# Patient Record
Sex: Female | Born: 2000 | Race: White | Hispanic: No | Marital: Single | Smoking: Never smoker
Health system: Southern US, Community
[De-identification: ages and names within clinical notes are randomized; demographics above are authoritative.]

## PROBLEM LIST (undated history)

## (undated) DIAGNOSIS — Z5189 Encounter for other specified aftercare: Secondary | ICD-10-CM

## (undated) DIAGNOSIS — F909 Attention-deficit hyperactivity disorder, unspecified type: Secondary | ICD-10-CM

## (undated) DIAGNOSIS — F419 Anxiety disorder, unspecified: Secondary | ICD-10-CM

## (undated) DIAGNOSIS — F32A Depression, unspecified: Secondary | ICD-10-CM

## (undated) HISTORY — DX: Anxiety disorder, unspecified: F41.9

## (undated) HISTORY — DX: Depression, unspecified: F32.A

## (undated) HISTORY — PX: TONSILLECTOMY: SUR1361

## (undated) HISTORY — DX: Encounter for other specified aftercare: Z51.89

## (undated) HISTORY — DX: Attention-deficit hyperactivity disorder, unspecified type: F90.9

---
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
## 2021-01-03 ENCOUNTER — Other Ambulatory Visit: Payer: Self-pay

## 2021-01-03 ENCOUNTER — Encounter: Payer: Self-pay | Admitting: Adult Health

## 2021-01-03 ENCOUNTER — Ambulatory Visit (INDEPENDENT_AMBULATORY_CARE_PROVIDER_SITE_OTHER): Payer: Medicaid Other | Admitting: Adult Health

## 2021-01-03 VITALS — BP 125/75 | HR 87 | Ht 66.0 in | Wt 194.0 lb

## 2021-01-03 DIAGNOSIS — Z30013 Encounter for initial prescription of injectable contraceptive: Secondary | ICD-10-CM | POA: Diagnosis not present

## 2021-01-03 DIAGNOSIS — Z113 Encounter for screening for infections with a predominantly sexual mode of transmission: Secondary | ICD-10-CM | POA: Diagnosis not present

## 2021-01-03 DIAGNOSIS — Z3202 Encounter for pregnancy test, result negative: Secondary | ICD-10-CM | POA: Insufficient documentation

## 2021-01-03 LAB — POCT URINE PREGNANCY: Preg Test, Ur: NEGATIVE

## 2021-01-03 MED ORDER — MEDROXYPROGESTERONE ACETATE 150 MG/ML IM SUSP
150.0000 mg | Freq: Once | INTRAMUSCULAR | Status: AC
  Administered 2021-01-03: 150 mg via INTRAMUSCULAR

## 2021-01-03 MED ORDER — MEDROXYPROGESTERONE ACETATE 150 MG/ML IM SUSP: 150.0000 mg | INTRAMUSCULAR | 4 refills | Status: DC

## 2021-01-03 NOTE — Progress Notes (Signed)
  Subjective:     Patient ID: Kelly Bray, female   DOB: 2000-12-04, 20 y.o.   MRN: [id number]  HPI Kelly Bray is a 20 year old white female,single, G0P0, in requesting to get back on Depo. PCP is [facility]   Review of Systems Periods not regular, last one in October Last sex over 2 months ago Reviewed past medical,surgical, social and family history. Reviewed medications and allergies.     Objective:   Physical Exam BP 125/75 (BP Location: Left Arm, Patient Position: Sitting, Cuff Size: Normal)   Pulse 87   Ht 5\' 6"  (1.676 m)   Wt 194 lb (88 kg)   LMP 07/19/2020 (Approximate)   BMI 31.31 kg/m UPT is negative. Skin warm and dry. Lungs: clear to ausculation bilaterally. Cardiovascular: regular rate and rhythm. Exam by 07/21/2020 NP student.  Upstream - 01/03/21 1216      Pregnancy Intention Screening   Does the patient want to become pregnant in the next year? No    Does the patient's partner want to become pregnant in the next year? No    Would the patient like to discuss contraceptive options today? No      Contraception Wrap Up   Current Method Female Condom    End Method Hormonal Injection    Contraception Counseling Provided No             Assessment:       1. Negative pregnancy test  2. Encounter for initial prescription of injectable contraceptive Will give depo in office today and rx sent Meds ordered this encounter  Medications  . medroxyPROGESTERone (DEPO-PROVERA) 150 MG/ML injection    Sig: Inject 1 mL (150 mg total) into the muscle every 3 (three) months.    Dispense:  1 mL    Refill:  4    Order Specific Question:   Supervising Provider    Answer:   03/05/21, LUTHER H [[id number]]  . medroxyPROGESTERone (DEPO-PROVERA) injection 150 mg  Use condoms   3. Screening examination for STD (sexually transmitted disease) GC/CHL sent on urine    Plan:   Return in about 12 weeks for depo Pap at 22

## 2021-01-03 NOTE — Patient Instructions (Signed)
12  

## 2021-01-18 ENCOUNTER — Encounter: Payer: Self-pay | Admitting: Pediatrics

## 2021-01-18 ENCOUNTER — Ambulatory Visit: Payer: Medicaid Other

## 2021-01-23 ENCOUNTER — Other Ambulatory Visit: Payer: Medicaid Other

## 2021-02-10 DIAGNOSIS — L700 Acne vulgaris: Secondary | ICD-10-CM | POA: Diagnosis not present

## 2021-03-21 ENCOUNTER — Ambulatory Visit: Payer: Medicaid Other

## 2021-05-17 ENCOUNTER — Telehealth: Payer: Self-pay | Admitting: Adult Health

## 2021-05-17 NOTE — Telephone Encounter (Signed)
Patient called stating that she has been bleeding a ot since she has stopped her Depo, patient is going to come in for an IUD insertion but her bleeding is pretty bad. Pt would like a call back if possible tomorrow morning.

## 2021-05-19 NOTE — Telephone Encounter (Signed)
Left message, to call me back

## 2021-05-22 ENCOUNTER — Other Ambulatory Visit: Payer: Self-pay

## 2021-05-22 ENCOUNTER — Ambulatory Visit (INDEPENDENT_AMBULATORY_CARE_PROVIDER_SITE_OTHER): Payer: Medicaid Other | Admitting: Adult Health

## 2021-05-22 ENCOUNTER — Encounter: Payer: Self-pay | Admitting: Adult Health

## 2021-05-22 VITALS — BP 125/70 | HR 85 | Ht 66.0 in | Wt 179.6 lb

## 2021-05-22 DIAGNOSIS — Z113 Encounter for screening for infections with a predominantly sexual mode of transmission: Secondary | ICD-10-CM

## 2021-05-22 DIAGNOSIS — Z3202 Encounter for pregnancy test, result negative: Secondary | ICD-10-CM

## 2021-05-22 DIAGNOSIS — N926 Irregular menstruation, unspecified: Secondary | ICD-10-CM | POA: Diagnosis not present

## 2021-05-22 DIAGNOSIS — Z3009 Encounter for other general counseling and advice on contraception: Secondary | ICD-10-CM | POA: Diagnosis not present

## 2021-05-22 LAB — POCT URINE PREGNANCY: Preg Test, Ur: NEGATIVE

## 2021-05-22 MED ORDER — MISOPROSTOL 200 MCG PO TABS: ORAL_TABLET | ORAL | 0 refills | Status: DC

## 2021-05-22 NOTE — Progress Notes (Signed)
  Subjective:     Patient ID: Kelly Bray, female   DOB: [birth year], 20 y.o.   MRN: [id number]  HPI Kelly Bray is a 20 year old white female,single, G0P0, in to discuss birth control, she is thinking mirena. Periods are irregular, and does not have unless on birth control. She stopped depo, and says OCs give her acne.  Review of Systems +irregular periods +acne No complaints with sex Reviewed past medical,surgical, social and family history. Reviewed medications and allergies.     Objective:   Physical Exam BP 125/70 (BP Location: Left Arm, Patient Position: Sitting, Cuff Size: Normal)   Pulse 85   Ht 5\' 6"  (1.676 m)   Wt 179 lb 9.6 oz (81.5 kg)   LMP 03/31/2021 (Approximate) Comment: has been bleeding since she stopped the depo  BMI 28.99 kg/m  UPT is negative.Skin warm and dry. Neck: mid line trachea, normal thyroid, good ROM, no lymphadenopathy noted. Lungs: clear to ausculation bilaterally. Cardiovascular: regular rate and rhythm.   Upstream - 05/22/21 0920       Pregnancy Intention Screening   Does the patient want to become pregnant in the next year? No    Does the patient's partner want to become pregnant in the next year? No    Would the patient like to discuss contraceptive options today? No      Contraception Wrap Up   Current Method Female Condom    End Method IUD or IUS;Female Condom    Contraception Counseling Provided Yes                Assessment:      1. Pregnancy test negative  2. Screen for STD (sexually transmitted disease Urine sent for GC/CHL  3. General counseling and advice on contraceptive management Discussed options and she wants mirena IUD, handout given  4. Irregular periods     Plan:     NO sex till after IUD placed Will rx cytotec before IUD placement Meds ordered this encounter  Medications   misoprostol (CYTOTEC) 200 MCG tablet    Sig: Take 2 tablets in am, 4 hours prior to IUD in afternoon    Dispense:  2 tablet    Refill:   0    Order Specific Question:   Supervising Provider    Answer:   05/24/21 [[id number]]   Return 9/1 for IUD placement with Dr 11/1 in pm

## 2021-05-25 LAB — GC/CHLAMYDIA PROBE AMP
Chlamydia trachomatis, NAA: NEGATIVE
Neisseria Gonorrhoeae by PCR: NEGATIVE

## 2021-05-29 ENCOUNTER — Telehealth: Payer: Self-pay

## 2021-05-29 NOTE — Telephone Encounter (Signed)
-----   Message from Adline Potter, NP sent at 05/26/2021  2:08 PM EDT ----- Let pt know GC.CHL negative

## 2021-05-29 NOTE — Telephone Encounter (Signed)
Called pt to relay test results. Pt confirmed understanding, no questions.

## 2021-05-30 DIAGNOSIS — S66911A Strain of unspecified muscle, fascia and tendon at wrist and hand level, right hand, initial encounter: Secondary | ICD-10-CM | POA: Diagnosis not present

## 2021-05-31 ENCOUNTER — Ambulatory Visit: Payer: Medicaid Other | Admitting: Adult Health

## 2021-05-31 ENCOUNTER — Encounter: Payer: Self-pay | Admitting: Women's Health

## 2021-05-31 ENCOUNTER — Ambulatory Visit (INDEPENDENT_AMBULATORY_CARE_PROVIDER_SITE_OTHER): Payer: Medicaid Other | Admitting: Women's Health

## 2021-05-31 ENCOUNTER — Other Ambulatory Visit: Payer: Self-pay

## 2021-05-31 VITALS — BP 116/67 | HR 82 | Ht 66.0 in | Wt 179.0 lb

## 2021-05-31 DIAGNOSIS — Z3202 Encounter for pregnancy test, result negative: Secondary | ICD-10-CM | POA: Diagnosis not present

## 2021-05-31 DIAGNOSIS — Z30430 Encounter for insertion of intrauterine contraceptive device: Secondary | ICD-10-CM | POA: Diagnosis not present

## 2021-05-31 LAB — POCT URINE PREGNANCY: Preg Test, Ur: NEGATIVE

## 2021-05-31 MED ORDER — LEVONORGESTREL 20 MCG/DAY IU IUD
1.0000 | INTRAUTERINE_SYSTEM | Freq: Once | INTRAUTERINE | Status: AC
  Administered 2021-05-31: 1 via INTRAUTERINE

## 2021-05-31 NOTE — Progress Notes (Signed)
   IUD INSERTION Patient name: Kelly Bray MRN [id number]  Date of birth: 05/06/2001 Subjective Findings:   Kelly Bray is a 20 y.o. G0P0000 Caucasian female being seen today for insertion of a Mirena IUD. Took cytotec prior as directed.  No LMP recorded. Last sexual intercourse was >2wks ago Last pap<22yo. Results were: N/A  The risks and benefits of the method and placement have been thouroughly reviewed with the patient and all questions were answered.  Specifically the patient is aware of failure rate of 10/998, expulsion of the IUD and of possible perforation.  The patient is aware of irregular bleeding due to the method and understands the incidence of irregular bleeding diminishes with time.  Signed copy of informed consent in chart.   Depression screen [facility] 2/9 10/09/2019 12/22/2014  Decreased Interest 0 0  Down, Depressed, Hopeless 0 0  PHQ - 2 Score 0 0    No flowsheet data found.   Pertinent History Reviewed:   Reviewed past medical,surgical, social, obstetrical and family history.  Reviewed problem list, medications and allergies. Objective Findings & Procedure:   Vitals:   05/31/21 1453  BP: 116/67  Pulse: 82  Weight: 179 lb (81.2 kg)  Height: 5\' 6"  (1.676 m)  Body mass index is 28.89 kg/m.  Results for orders placed or performed in visit on 05/31/21 (from the past 24 hour(s))  POCT urine pregnancy   Collection Time: 05/31/21  2:57 PM  Result Value Ref Range   Preg Test, Ur Negative Negative     Time out was performed.  A graves speculum was placed in the vagina.  The cervix was visualized, prepped using Betadine, and grasped with a single tooth tenaculum. The uterus was found to be anteroflexed and it sounded to 7 cm.  Mirena  IUD placed per manufacturer's recommendations. The strings were trimmed to approximately 3 cm. The patient became nauseated and vomited.  Ibuprofen 800mg  po given for cramping per request  Informal transvaginal sonogram was  performed and the proper placement of the IUD was verified.  Chaperone: 06/02/21   Assessment & Plan:   1) Mirena IUD insertion The patient was given post procedure instructions, including signs and symptoms of infection and to check for the strings after each menses or each month, and refraining from intercourse or anything in the vagina for 3 days. She was given a care card with date IUD placed, and date IUD to be removed. She is scheduled for a f/u appointment in 4 weeks.  Orders Placed This Encounter  Procedures   POCT urine pregnancy    Return in about 4 weeks (around 06/28/2021) for IUD f/u, CNM, in person.  Faith Rogue CNM, [facility] 05/31/2021 3:52 PM

## 2021-05-31 NOTE — Patient Instructions (Signed)
Nothing in vagina for 3 days (no sex, douching, tampons, etc...) Check your strings once a month to make sure you can feel them, if you are not able to please let us know If you develop a fever of 100.4 or more in the next few weeks, or if you develop severe abdominal pain, please let us know Use a backup method of birth control, such as condoms, for 2 weeks  Intrauterine Device Insertion, Care After This sheet gives you information about how to care for yourself after your procedure. Your health care provider may also give you more specific instructions. If you have problems or questions, contact your health care provider. What can I expect after the procedure? After the procedure, it is common to have: Cramps and pain in the abdomen. Bleeding. It may be light or heavy. This may last for a few days. Lower back pain. Dizziness. Headaches. Nausea. Follow these instructions at home:  Before resuming sexual activity, check to make sure that you can feel the IUD string or strings. You should be able to feel the end of the string below the opening of your cervix. If your IUD string is in place, you may resume sexual activity. If you had a hormonal IUD inserted more than 7 days after your most recent period started, you will need to use a backup method of birth control for 7 days after IUD insertion. Ask your health care provider whether this applies to you. Continue to check that the IUD is still in place by feeling for the strings after every menstrual period, or once a month. An IUD will not protect you from sexually transmitted infections (STIs). Use methods to prevent the exchange of body fluids between partners (barrier protection) every time you have sex. Barrier protection can be used during oral, vaginal, or anal sex. Commonly used barrier methods include: Female condom. Female condom. Dental dam. Take over-the-counter and prescription medicines only as told by your health care  provider. Keep all follow-up visits as told by your health care provider. This is important. Contact a health care provider if: You feel light-headed or weak. You have any of the following problems with your IUD string or strings: The string bothers or hurts you or your sexual partner. You cannot feel the string. The string has gotten longer. You can feel the IUD in your vagina. You think you may be pregnant, or you miss your menstrual period. You think you may have a sexually transmitted infection (STI). Get help right away if: You have flu-like symptoms, such as tiredness (fatigue) and muscle aches. You have a fever and chills. You have bleeding that is heavier or lasts longer than a normal menstrual cycle. You have abnormal or bad-smelling discharge from your vagina. You develop abdominal pain that is new, is getting worse, or is not in the same area of earlier cramping and pain. You have pain during sexual activity. Summary After the procedure, it is common to have cramps and pain in the abdomen. It is also common to have light bleeding or heavier bleeding that is like your menstrual period. Continue to check that the IUD is still in place by feeling for the strings after every menstrual period, or once a month. Keep all follow-up visits as told by your health care provider. This is important. Contact your health care provider if you have problems with your IUD strings, such as the string getting longer or bothering you or your sexual partner. This information is not intended   to replace advice given to you by your health care provider. Make sure you discuss any questions you have with your health care provider. Document Revised: 09/08/2019 Document Reviewed: 09/08/2019 Elsevier Patient Education  2022 Elsevier Inc.  

## 2021-06-01 ENCOUNTER — Ambulatory Visit: Payer: Medicaid Other | Admitting: Obstetrics & Gynecology

## 2021-06-06 DIAGNOSIS — H00021 Hordeolum internum right upper eyelid: Secondary | ICD-10-CM | POA: Diagnosis not present

## 2021-06-28 ENCOUNTER — Ambulatory Visit: Payer: Medicaid Other | Admitting: Women's Health

## 2021-07-27 DIAGNOSIS — H00015 Hordeolum externum left lower eyelid: Secondary | ICD-10-CM | POA: Diagnosis not present

## 2021-11-30 ENCOUNTER — Ambulatory Visit: Payer: Medicaid Other | Admitting: Women's Health

## 2021-12-07 DIAGNOSIS — F411 Generalized anxiety disorder: Secondary | ICD-10-CM | POA: Diagnosis not present

## 2021-12-07 DIAGNOSIS — F321 Major depressive disorder, single episode, moderate: Secondary | ICD-10-CM | POA: Diagnosis not present

## 2021-12-12 ENCOUNTER — Encounter: Payer: Self-pay | Admitting: Adult Health

## 2021-12-12 ENCOUNTER — Ambulatory Visit (INDEPENDENT_AMBULATORY_CARE_PROVIDER_SITE_OTHER): Payer: Medicaid Other | Admitting: Adult Health

## 2021-12-12 ENCOUNTER — Other Ambulatory Visit: Payer: Self-pay

## 2021-12-12 VITALS — BP 100/64 | HR 71 | Ht 67.0 in | Wt 201.0 lb

## 2021-12-12 DIAGNOSIS — N926 Irregular menstruation, unspecified: Secondary | ICD-10-CM | POA: Diagnosis not present

## 2021-12-12 DIAGNOSIS — N911 Secondary amenorrhea: Secondary | ICD-10-CM | POA: Insufficient documentation

## 2021-12-12 DIAGNOSIS — Z975 Presence of (intrauterine) contraceptive device: Secondary | ICD-10-CM

## 2021-12-12 NOTE — Progress Notes (Signed)
?  Subjective:  ?  ? Patient ID: Kelly Bray, female   DOB: [birth year], 21 y.o.   MRN: [id number] ? ?HPI ?Kelly Bray is a 21 year old white female, single, G0P0 in wondering if hormones off.Has no period, is having anxiety, and depression, moody and acne. ?Had IUD placed 05/31/21. She did not start period til age 21, when started on OCs. ? ? ?Review of Systems ?History of irregualr periods, or no periods unless on birth control, did not start til age 21, when place on OCs ?Has anxiety and depression, recently started on Prozac ?Moody swings ?Acne ?Reviewed past medical,surgical, social and family history. Reviewed medications and allergies.  ?   ?Objective:  ? Physical Exam ?BP 100/64 (BP Location: Left Arm, Patient Position: Sitting, Cuff Size: Large)   Pulse 71   Ht 5\' 7"  (1.702 m)   Wt 201 lb (91.2 kg)   BMI 31.48 kg/m?   ?  Skin warm and dry.Pelvic: external genitalia is normal in appearance no lesions, vagina: scant brown discharge without odor,urethra has no lesions or masses noted, cervix:smooth, no IUD strings seen at os, POC US showed [place] in place, uterus: normal size, shape and contour, non tender, no masses felt, adnexa: no masses or tenderness noted. Bladder is non tender and no masses felt.  ?Fall risk is low ? Upstream - 12/12/21 1615   ? ?  ? Pregnancy Intention Screening  ? Does the patient want to become pregnant in the next year? No   ? Does the patient's partner want to become pregnant in the next year? No   ? Would the patient like to discuss contraceptive options today? No   ?  ? Contraception Wrap Up  ? Current Method IUD or IUS   ? End Method IUD or IUS   ? ?  ?  ? ?  ? Examination chaperoned by Levy Pupa LPN ? ?Assessment:  ?   ?1. Irregular periods ? ?2. IUD (intrauterine device) in place ?Mirena Placed 05/31/21 ? ?3. Secondary amenorrhea ?Will check [place] and testosterone, frre and total ?   ?Plan:  ?   ?Follow up in 4 weeks  ?   ?

## 2021-12-21 DIAGNOSIS — F411 Generalized anxiety disorder: Secondary | ICD-10-CM | POA: Diagnosis not present

## 2021-12-21 DIAGNOSIS — F321 Major depressive disorder, single episode, moderate: Secondary | ICD-10-CM | POA: Diagnosis not present

## 2022-01-09 ENCOUNTER — Ambulatory Visit: Payer: Medicaid Other | Admitting: Adult Health

## 2022-01-16 DIAGNOSIS — F411 Generalized anxiety disorder: Secondary | ICD-10-CM | POA: Diagnosis not present

## 2022-01-16 DIAGNOSIS — F321 Major depressive disorder, single episode, moderate: Secondary | ICD-10-CM | POA: Diagnosis not present

## 2022-01-23 ENCOUNTER — Other Ambulatory Visit (HOSPITAL_COMMUNITY)
Admission: RE | Admit: 2022-01-23 | Discharge: 2022-01-23 | Disposition: A | Discharge status: Home / Self Care | Payer: Medicaid Other | Source: Ambulatory Visit | Attending: Obstetrics and Gynecology | Admitting: Obstetrics and Gynecology

## 2022-01-23 ENCOUNTER — Encounter: Payer: Self-pay | Admitting: Obstetrics and Gynecology

## 2022-01-23 ENCOUNTER — Ambulatory Visit (INDEPENDENT_AMBULATORY_CARE_PROVIDER_SITE_OTHER): Payer: 59 | Admitting: Obstetrics and Gynecology

## 2022-01-23 VITALS — BP 110/68 | HR 78 | Ht 67.0 in | Wt 201.0 lb

## 2022-01-23 DIAGNOSIS — N898 Other specified noninflammatory disorders of vagina: Secondary | ICD-10-CM | POA: Insufficient documentation

## 2022-01-23 DIAGNOSIS — Z01419 Encounter for gynecological examination (general) (routine) without abnormal findings: Secondary | ICD-10-CM | POA: Diagnosis not present

## 2022-01-23 DIAGNOSIS — Z113 Encounter for screening for infections with a predominantly sexual mode of transmission: Secondary | ICD-10-CM | POA: Diagnosis not present

## 2022-01-23 NOTE — Patient Instructions (Signed)

## 2022-01-23 NOTE — Progress Notes (Signed)
21 y.o. G55P0000 Single Caucasian female here for annual exam.   ? ?Has Mirena IUD.  ?Had one menstrual cycle.  ? ?Was on birth control, Depo Provera in past.  ?Generally does not like hormones. ? ?Her periods started at age 25 yo.  ?Tested for PCOS which was negative.  ?She has had normal FSH, prolactin, and TSH in 202.  ? ?She [facility].  ?POC US showed the IUD in the uterus.  Normal uterus.  ? ?Experiencing some anxiety.  ?Taking Zoloft and doing counseling.  ? ?Having vaginal discharge are odor all the time.  ?She douches. ? ?New partner.  ?Accepts vaginal/cervical SD testing but not blood work today. ? ?Going to cosmatology school.  ?Working at [employer].  ?Living with her boyfriend.  ? ? ?PCP:   None ? ?No LMP recorded. (Menstrual status: IUD).     ?Period Cycle (Days):  (no having cycles with Mirena IUD) ?    ?Sexually active: Yes.    ?The current method of family planning is IUD--Mirena 07/2021.    ?Exercising: No.  The patient does not participate in regular exercise at present. ?Smoker:  no ? ?Health Maintenance: ?Pap:  n/a ?History of abnormal Pap:  n/a ?MMG:  n/a ?Colonoscopy:  n/a ?BMD:   n/a  Result  n/a ?TDaP:  02-03-13 ?Gardasil:   thinks she completed ?HIV: never ?Hep C: never ?Screening Labs:   ? ? reports that she has never smoked. She has been exposed to tobacco smoke. She has never used smokeless tobacco. She reports that she does not drink alcohol and does not use drugs. ? ?Past Medical History:  ?Diagnosis Date  ? ADHD (attention deficit hyperactivity disorder)   ? ADHD (attention deficit hyperactivity disorder)   ? Anxiety   ? Blood transfusion without reported diagnosis   ? after tonsillectomy  ? Depression   ? ? ?Past Surgical History:  ?Procedure Laterality Date  ? TONSILLECTOMY    ? TONSILLECTOMY AND ADENOIDECTOMY N/A 12/19/2019  ? Procedure: CONTROL OF TONSIL BLEED;  Surgeon: Helayne Seminole, MD;  Location: [facility];  Service: ENT;  Laterality: N/A;   ? ? ?Current Outpatient Medications  ?Medication Sig Dispense Refill  ? cetirizine (ZYRTEC) 10 MG tablet Take 10 mg by mouth daily.    ? levonorgestrel (MIRENA) 20 MCG/DAY IUD 1 each by Intrauterine route once.    ? sertraline (ZOLOFT) 25 MG tablet Take 25 mg by mouth daily.    ? ?No current facility-administered medications for this visit.  ? ? ?History reviewed. No pertinent family history. ? ?Review of Systems  ?Genitourinary:  Positive for vaginal discharge (with odor).  ?All other systems reviewed and are negative. ? ?Exam:   ?BP 110/68   Pulse 78   Ht 5\' 7"  (1.702 m)   Wt 201 lb (91.2 kg)   SpO2 98%   BMI 31.48 kg/m?     ?General appearance: alert, cooperative and appears stated age ?Head: normocephalic, without obvious abnormality, atraumatic ?Neck: no adenopathy, supple, symmetrical, trachea midline and thyroid normal to inspection and palpation ?Lungs: clear to auscultation bilaterally ?Breasts: normal appearance, no masses or tenderness, No nipple retraction or dimpling, No nipple discharge or bleeding, No axillary adenopathy ?Heart: regular rate and rhythm ?Abdomen: soft, non-tender; no masses, no organomegaly ?Extremities: extremities normal, atraumatic, no cyanosis or edema ?Skin: skin color, texture, turgor normal. No rashes or lesions ?Lymph nodes: cervical, supraclavicular, and axillary nodes normal. ?Neurologic: grossly normal ? ?Pelvic: External genitalia:  no lesions ?             No abnormal inguinal nodes palpated. ?             Urethra:  normal appearing urethra with no masses, tenderness or lesions ?             Bartholins and Skenes: normal    ?             Vagina: normal appearing vagina with normal color and discharge, no lesions ?             Cervix: no lesions.  IUD strings not seen.  ?             Pap taken: no ?Bimanual Exam:  Uterus:  normal size, contour, position, consistency, mobility, non-tender ?             Adnexa: no mass, fullness, tenderness ?         ? ?Chaperone was  present for exam:  Estill Bamberg, CMA ? ?Assessment:   ?Well woman visit with gynecologic exam. ?Mirena IUD.  Recent US confirming location at [facility].  ?Vaginal odor.  ?STD screening.  ? ?Plan: ?Mammogram screening discussed. ?Self breast awareness reviewed. ?Pap next year. ?Guidelines for Calcium, Vitamin D, regular exercise program including cardiovascular and weight bearing exercise. ?We discussed PCOS and effect on menstruation, cardiovascular disease and fertility.  ?She declines blood work today for hormonal testing and STD screening.  ?Will check [place] for BV, yeast, trichomonas, GC/CT. ?Follow up annually and prn.  ? ?After visit summary provided.  ? ? ? ?

## 2022-01-24 LAB — CERVICOVAGINAL ANCILLARY ONLY
Bacterial Vaginitis (gardnerella): POSITIVE — AB
Candida Glabrata: NEGATIVE
Candida Vaginitis: NEGATIVE
Chlamydia: NEGATIVE
Comment: NEGATIVE
Comment: NEGATIVE
Comment: NEGATIVE
Comment: NEGATIVE
Comment: NEGATIVE
Comment: NORMAL
Neisseria Gonorrhea: NEGATIVE
Trichomonas: NEGATIVE

## 2022-01-26 ENCOUNTER — Other Ambulatory Visit: Payer: Self-pay | Admitting: *Deleted

## 2022-01-26 MED ORDER — METRONIDAZOLE 0.75 % VA GEL: 1.0000 | Freq: Every day | VAGINAL | 0 refills | Status: DC

## 2022-02-23 ENCOUNTER — Other Ambulatory Visit: Payer: Self-pay

## 2022-02-23 DIAGNOSIS — Z30432 Encounter for removal of intrauterine contraceptive device: Secondary | ICD-10-CM

## 2022-02-28 ENCOUNTER — Ambulatory Visit: Payer: 59 | Admitting: Obstetrics and Gynecology

## 2022-03-01 NOTE — Progress Notes (Deleted)
GYNECOLOGY  VISIT   HPI: 21 y.o.   Single  Caucasian  female   G0P0000 with No LMP recorded. (Menstrual status: IUD).   here for Mirena IUD removal.  GYNECOLOGIC HISTORY: No LMP recorded. (Menstrual status: IUD). 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   IUD (intrauterine device) in place 12/12/2021   Encounter for IUD insertion 05/31/2021   Acute blood loss anemia 12/20/2019   Tonsillar bleed 12/19/2019   Irregular periods 10/09/2019   Acne 12/22/2014   Influenza A (H1N1) 10/14/2013   Cutaneous wart 10/14/2013   ADHD (attention deficit hyperactivity disorder) 02/11/2013   Behavioral problems 02/11/2013    Past Medical History:  Diagnosis Date   ADHD (attention deficit hyperactivity disorder)    ADHD (attention deficit hyperactivity disorder)    Anxiety    Blood transfusion without reported diagnosis    after tonsillectomy   Depression     Past Surgical History:  Procedure Laterality Date   TONSILLECTOMY     TONSILLECTOMY AND ADENOIDECTOMY N/A 12/19/2019   Procedure: CONTROL OF TONSIL BLEED;  Surgeon: Graylin Shiver, MD;  Location: [facility] OR;  Service: ENT;  Laterality: N/A;    Current Outpatient Medications  Medication Sig Dispense Refill   cetirizine (ZYRTEC) 10 MG tablet Take 10 mg by mouth daily.     levonorgestrel (MIRENA) 20 MCG/DAY IUD 1 each by Intrauterine route once.     metroNIDAZOLE (METROGEL) 0.75 % vaginal gel Place 1 Applicatorful vaginally at bedtime. For 5 nights. 70 g 0   sertraline (ZOLOFT) 25 MG tablet Take 25 mg by mouth daily.     No current facility-administered medications for this visit.      ALLERGIES: Patient has no known allergies.  No family history on file.  Social History   Socioeconomic History   Marital status: Single    Spouse name: Not on file   Number of children: Not on file   Years of education: Not on file   Highest education level: Not on file  Occupational History   Not on file  Tobacco Use   Smoking status: Never    Passive exposure: Yes   Smokeless tobacco: Never  Vaping Use   Vaping Use: Never used  Substance and Sexual Activity   Alcohol use: No   Drug use: No   Sexual activity: Yes    Birth control/protection: I.U.D.    Comment: Mirena IUD 07/2021  Other Topics Concern   Not on file  Social History Narrative   Lives with mother and brother.   Father and mother seperated.   Do spend some time with the father.   Social Determinants of Health   Financial Resource Strain: Not on file  Food Insecurity: Not on file  Transportation Needs: Not on file  Physical Activity: Not on file  Stress: Not on file  Social Connections: Not on file  Intimate Partner Violence: Not on file    Review of Systems  PHYSICAL EXAMINATION:    There were no vitals taken for this visit.    General appearance: alert, cooperative and appears stated age Head: Normocephalic, without obvious abnormality, atraumatic Neck: no adenopathy, supple, symmetrical, trachea midline and thyroid normal to inspection and palpation Lungs: clear to auscultation bilaterally Breasts: normal appearance, no masses or tenderness, No nipple retraction or dimpling, No nipple discharge or bleeding, No axillary or supraclavicular adenopathy Heart: regular rate and rhythm Abdomen: soft, non-tender, no masses,  no organomegaly Extremities: extremities normal, atraumatic, no cyanosis or edema Skin: Skin color, texture, turgor normal. No rashes or lesions Lymph nodes: Cervical, supraclavicular, and axillary nodes normal. No abnormal inguinal nodes palpated Neurologic: Grossly  normal  Pelvic: External genitalia:  no lesions              Urethra:  normal appearing urethra with no masses, tenderness or lesions              Bartholins and Skenes: normal                 Vagina: normal appearing vagina with normal color and discharge, no lesions              Cervix: no lesions                Bimanual Exam:  Uterus:  normal size, contour, position, consistency, mobility, non-tender              Adnexa: no mass, fullness, tenderness              Rectal exam: {yes no:314532}.  Confirms.              Anus:  normal sphincter tone, no lesions  Chaperone was present for exam:  ***  ASSESSMENT     PLAN     An After Visit Summary was printed and given to the patient.  ______ minutes face to face time of which over 50% was spent in counseling.

## 2022-03-06 ENCOUNTER — Ambulatory Visit: Payer: 59 | Admitting: Obstetrics and Gynecology

## 2022-03-26 NOTE — Progress Notes (Signed)
GYNECOLOGY  VISIT   HPI: 21 y.o.   Single  Caucasian  female   G0P0000 with No LMP recorded. (Menstrual status: IUD).   here for  IUD removal.   Patient interested in a non-hormonal birth control.     Interested in [place] IUD  She feels like her appetite is increased and she has increased anxiety and depression.   She has been on medication in the past for depression and anxiety through [employer].   Tested negative for PCOS in 2018.   IUD strings not seen with prior examination.  She had a point of care pelvic US with the [facility], and the IUD was noted in the endometrial canal.          With her IUD insertion the past, she stated she had a weak stomach, and she threw up.           She states she has vaginal odor today.  Asking about treatment for BV.  No new partner.                                                                                                                                                                                                             GYNECOLOGIC HISTORY: No LMP recorded. (Menstrual status: IUD). 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/12/2021   IUD (intrauterine device) in place 12/12/2021   Encounter for IUD insertion 05/31/2021   Acute blood loss anemia 12/20/2019   Tonsillar bleed 12/19/2019   Irregular periods 10/09/2019   Acne 12/22/2014   Influenza A (H1N1) 10/14/2013   Cutaneous wart 10/14/2013   ADHD (attention deficit hyperactivity disorder) 02/11/2013   Behavioral problems 02/11/2013    Past Medical History:  Diagnosis Date   ADHD (attention deficit hyperactivity  disorder)    ADHD (attention deficit hyperactivity disorder)    Anxiety    Blood transfusion without reported  diagnosis    after tonsillectomy   Depression     Past Surgical History:  Procedure Laterality Date   TONSILLECTOMY     TONSILLECTOMY AND ADENOIDECTOMY N/A 12/19/2019   Procedure: CONTROL OF TONSIL BLEED;  Surgeon: Graylin Shiver, MD;  Location: [facility] OR;  Service: ENT;  Laterality: N/A;    Current Outpatient Medications  Medication Sig Dispense Refill   levonorgestrel (MIRENA) 20 MCG/DAY IUD 1 each by Intrauterine route once.     No current facility-administered medications for this visit.     ALLERGIES: Patient has no known allergies.  History reviewed. No pertinent family history.  Social History   Socioeconomic History   Marital status: Single    Spouse name: Not on file   Number of children: Not on file   Years of education: Not on file   Highest education level: Not on file  Occupational History   Not on file  Tobacco Use   Smoking status: Never    Passive exposure: Yes   Smokeless tobacco: Never  Vaping Use   Vaping Use: Never used  Substance and Sexual Activity   Alcohol use: No   Drug use: No   Sexual activity: Yes    Birth control/protection: I.U.D.    Comment: Mirena IUD 07/2021  Other Topics Concern   Not on file  Social History Narrative   Lives with mother and brother.   Father and mother seperated.   Do spend some time with the father.   Social Determinants of Health   Financial Resource Strain: Not on file  Food Insecurity: Not on file  Transportation Needs: Not on file  Physical Activity: Not on file  Stress: Not on file  Social Connections: Not on file  Intimate Partner Violence: Not on file    Review of Systems  All other systems reviewed and are negative.   PHYSICAL EXAMINATION:    BP 110/70   Ht 5\' 7"  (1.702 m)   Wt 201 lb (91.2 kg)   BMI 31.48 kg/m     General appearance: alert, cooperative and appears stated  age  Pelvic: External genitalia:  no lesions              Urethra:  normal appearing urethra with no masses, tenderness or lesions              Bartholins and Skenes: normal                 Vagina: normal appearing vagina with normal color and discharge, no lesions              Cervix: no lesions.  Yellow discharge.  IUD strings not seen.  Cervix looks fairly closed.                 Bimanual Exam:  Uterus:  normal size, contour, position, consistency, mobility, non-tender              Adnexa: no mass, fullness, tenderness          Chaperone was present for exam:  , CMA  ASSESSMENT  Mirena IUD.  Contraceptive counseling.  Vaginitis.  Hx BV. Depression and anxiety.  PLAN  We discussed Paragard IUD risks and benefits.  She will return for IUD exchange.  Removal of Mirena and placement of Paragard.  Paragard brochure to patient.  Cytotec 200 mcg pv at hs the night before the procedure and the am of the procedure.  Motrin 800 mg po prior to procedure.  Plan for support person available the day of the IUD exchange.  Vaginitis testing through [facility].  Will need to complete any treatment for BV prior to IUD exchange.  I gave The QR reader for [facility] providers and for [place] counseling group.    An After Visit Summary was printed and given to the patient.  34 min  total time was spent for this patient encounter, including preparation, face-to-face counseling with the patient, coordination of care, and documentation of the encounter.

## 2022-03-28 ENCOUNTER — Other Ambulatory Visit (HOSPITAL_COMMUNITY)
Admission: RE | Admit: 2022-03-28 | Discharge: 2022-03-28 | Disposition: A | Discharge status: Home / Self Care | Payer: Medicaid Other | Source: Ambulatory Visit | Attending: Obstetrics and Gynecology | Admitting: Obstetrics and Gynecology

## 2022-03-28 ENCOUNTER — Encounter: Payer: Self-pay | Admitting: Obstetrics and Gynecology

## 2022-03-28 ENCOUNTER — Ambulatory Visit (INDEPENDENT_AMBULATORY_CARE_PROVIDER_SITE_OTHER): Payer: 59 | Admitting: Obstetrics and Gynecology

## 2022-03-28 VITALS — BP 110/70 | Ht 67.0 in | Wt 201.0 lb

## 2022-03-28 DIAGNOSIS — Z3009 Encounter for other general counseling and advice on contraception: Secondary | ICD-10-CM

## 2022-03-28 DIAGNOSIS — N760 Acute vaginitis: Secondary | ICD-10-CM

## 2022-03-28 MED ORDER — MISOPROSTOL 200 MCG PO TABS: ORAL_TABLET | ORAL | 0 refills | Status: DC

## 2022-03-29 LAB — CERVICOVAGINAL ANCILLARY ONLY
Bacterial Vaginitis (gardnerella): NEGATIVE
Candida Glabrata: NEGATIVE
Candida Vaginitis: NEGATIVE
Comment: NEGATIVE
Comment: NEGATIVE
Comment: NEGATIVE
Comment: NEGATIVE
Trichomonas: NEGATIVE

## 2022-04-10 NOTE — Progress Notes (Unsigned)
GYNECOLOGY  VISIT   HPI: 21 y.o.   Single  Caucasian  female   G0P0000 with No LMP recorded. (Menstrual status: IUD).   here for IUD exchange.   Took Motrin prior to the visit.  She did not use Cytotec.   Patient has a Mirena IUD and would like a ParaGard IUD.  Wants to be hormone free for her contraception.   Her strings are not visible and she has had a pelvic US at the [facility] showing her IUD in the endometrial canal.  Negative UPT today.   GYNECOLOGIC HISTORY: No LMP recorded. (Menstrual status: IUD). 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   IUD (intrauterine device) in place 12/12/2021   Encounter for IUD insertion 05/31/2021   Acute blood loss anemia 12/20/2019   Tonsillar bleed 12/19/2019   Irregular periods 10/09/2019   Acne 12/22/2014   Influenza A (H1N1) 10/14/2013   Cutaneous wart 10/14/2013   ADHD (attention deficit hyperactivity disorder) 02/11/2013   Behavioral problems 02/11/2013    Past Medical History:  Diagnosis Date   ADHD (attention deficit hyperactivity disorder)    ADHD (attention deficit hyperactivity disorder)    Anxiety    Blood transfusion without reported diagnosis    after tonsillectomy   Depression     Past Surgical History:  Procedure Laterality Date   TONSILLECTOMY     TONSILLECTOMY AND ADENOIDECTOMY N/A 12/19/2019   Procedure: CONTROL OF TONSIL BLEED;  Surgeon: Graylin Shiver, MD;  Location: [facility] OR;  Service: ENT;  Laterality: N/A;    Current Outpatient Medications  Medication Sig Dispense Refill   levonorgestrel (MIRENA) 20 MCG/DAY IUD 1 each by Intrauterine route  once.     No current facility-administered medications for this visit.     ALLERGIES: Patient has no known allergies.  History reviewed. No pertinent family history.  Social History   Socioeconomic History   Marital status: Single    Spouse name: Not on file   Number of children: Not on file   Years of education: Not on file   Highest education level: Not on file  Occupational History   Not on file  Tobacco Use   Smoking status: Never    Passive exposure: Yes   Smokeless tobacco: Never  Vaping Use   Vaping Use: Never used  Substance and Sexual Activity   Alcohol use: No   Drug use: No   Sexual activity: Yes    Birth control/protection: I.U.D.    Comment: Mirena IUD 07/2021  Other Topics Concern   Not on file  Social History Narrative   Lives with mother and brother.   Father and mother seperated.   Do spend some time with the father.   Social Determinants of Health   Financial Resource Strain: Not on file  Food Insecurity: Not on file  Transportation Needs: Not on  file  Physical Activity: Not on file  Stress: Not on file  Social Connections: Not on file  Intimate Partner Violence: Not on file    Review of Systems  All other systems reviewed and are negative.   PHYSICAL EXAMINATION:    BP 128/70   Pulse 90   Ht 5\' 7"  (1.702 m)   Wt 201 lb (91.2 kg)   SpO2 97%   BMI 31.48 kg/m     General appearance: alert, cooperative and appears stated age    Pelvic: External genitalia:  no lesions              Urethra:  normal appearing urethra with no masses, tenderness or lesions              Bartholins and Skenes: normal                 Vagina: normal appearing vagina with normal color and discharge, no lesions              Cervix: no lesions                Bimanual Exam:  Uterus:  normal size, contour, position, consistency, mobility, non-tender              Adnexa: no mass, fullness, tenderness  Mirena IUD removal and reinsertion of Paragard IUD.   Paragard IUD lot number , exp [id number].  Consent for procedures done.  Sterile prep with Hibiclens.  Paracervical block 10 cc 1% lidocaine, lot [id number], exp 10/02/23.  Os finder used to dilate cervix.  IUD remover used to grasp strings in the cervix.  Dressing forceps used to remove the IUD, intact, declined to be seen by patient, and discarded.  Uterus sounded to 6.3 cm.  Paragard IUD placed without difficulty.  Strings trimmed and shown to patient.  No complications.  Minimal EBL. Repeat Bimanual exam, no change.          Chaperone was present for exam:  11/30/23, CMA  ASSESSMENT  Removal of Mirena IUD and placement of Paragard IUD.    PLAN  Post IUD insertion precautions given.  Back up contraception for 1 week.  IUD card to patient.  Fu in 4 weeks for IUD check up.     An After Visit Summary was printed and given to the patient.

## 2022-04-11 ENCOUNTER — Encounter: Payer: Self-pay | Admitting: Obstetrics and Gynecology

## 2022-04-11 ENCOUNTER — Ambulatory Visit (INDEPENDENT_AMBULATORY_CARE_PROVIDER_SITE_OTHER): Payer: 59 | Admitting: Obstetrics and Gynecology

## 2022-04-11 VITALS — BP 128/70 | HR 90 | Ht 67.0 in | Wt 201.0 lb

## 2022-04-11 DIAGNOSIS — Z01812 Encounter for preprocedural laboratory examination: Secondary | ICD-10-CM

## 2022-04-11 DIAGNOSIS — Z3009 Encounter for other general counseling and advice on contraception: Secondary | ICD-10-CM

## 2022-04-11 DIAGNOSIS — Z30433 Encounter for removal and reinsertion of intrauterine contraceptive device: Secondary | ICD-10-CM | POA: Diagnosis not present

## 2022-04-11 LAB — PREGNANCY, URINE: Preg Test, Ur: NEGATIVE

## 2022-04-11 NOTE — Patient Instructions (Signed)
Intrauterine Device Insertion, Care After This sheet gives you information about how to care for yourself after your procedure. Your health care provider may also give you more specific instructions. If you have problems or questions, contact your health care provider. What can I expect after the procedure? After the procedure, it is common to have: Cramps and pain in the abdomen. Bleeding. It may be light or heavy. This may last for a few days. Lower back pain. Dizziness. Headaches. Nausea. Follow these instructions at home:  Before resuming sexual activity, check to make sure that you can feel the IUD string or strings. You should be able to feel the end of the string below the opening of your cervix. If your IUD string is in place, you may resume sexual activity. If you had a hormonal IUD inserted more than 7 days after your most recent period started, you will need to use a backup method of birth control for 7 days after IUD insertion. Ask your health care provider whether this applies to you. Continue to check that the IUD is still in place by feeling for the strings after every menstrual period, or once a month. An IUD will not protect you from sexually transmitted infections (STIs). Use methods to prevent the exchange of body fluids between partners (barrier protection) every time you have sex. Barrier protection can be used during oral, vaginal, or anal sex. Commonly used barrier methods include: Female condom. Female condom. Dental dam. Take over-the-counter and prescription medicines only as told by your health care provider. Keep all follow-up visits as told by your health care provider. This is important. Contact a health care provider if: You feel light-headed or weak. You have any of the following problems with your IUD string or strings: The string bothers or hurts you or your sexual partner. You cannot feel the string. The string has gotten longer. You can feel the IUD in  your vagina. You think you may be pregnant, or you miss your menstrual period. You think you may have a sexually transmitted infection (STI). Get help right away if: You have flu-like symptoms, such as tiredness (fatigue) and muscle aches. You have a fever and chills. You have bleeding that is heavier or lasts longer than a normal menstrual cycle. You have abnormal or bad-smelling discharge from your vagina. You develop abdominal pain that is new, is getting worse, or is not in the same area of earlier cramping and pain. You have pain during sexual activity. Summary After the procedure, it is common to have cramps and pain in the abdomen. It is also common to have light bleeding or heavier bleeding that is like your menstrual period. Continue to check that the IUD is still in place by feeling for the strings after every menstrual period, or once a month. Keep all follow-up visits as told by your health care provider. This is important. Contact your health care provider if you have problems with your IUD strings, such as the string getting longer or bothering you or your sexual partner. This information is not intended to replace advice given to you by your health care provider. Make sure you discuss any questions you have with your health care provider. Document Revised: 09/08/2019 Document Reviewed: 09/08/2019 Elsevier Patient Education  2022 [employer]  

## 2022-04-13 ENCOUNTER — Ambulatory Visit (INDEPENDENT_AMBULATORY_CARE_PROVIDER_SITE_OTHER): Payer: Medicaid Other | Admitting: Adult Health

## 2022-04-13 ENCOUNTER — Encounter: Payer: Self-pay | Admitting: Adult Health

## 2022-04-13 VITALS — BP 110/70 | HR 107 | Temp 99.3°F | Ht 66.5 in | Wt 208.0 lb

## 2022-04-13 DIAGNOSIS — Z7689 Persons encountering health services in other specified circumstances: Secondary | ICD-10-CM

## 2022-04-13 DIAGNOSIS — F900 Attention-deficit hyperactivity disorder, predominantly inattentive type: Secondary | ICD-10-CM | POA: Diagnosis not present

## 2022-04-13 NOTE — Progress Notes (Signed)
Patient presents to clinic today to establish care. She is a pleasant 21 year old female who  has a past medical history of ADHD (attention deficit hyperactivity disorder), ADHD (attention deficit hyperactivity disorder), Anxiety, Blood transfusion without reported diagnosis, and Depression.   Acute Concerns: Establish Care   Chronic Issues: ADHD - Reports has been on medication of some form for most of her life, adderall, and Vyvance. Quit taking medication about 2 years ago and would like to go back on it to help her focus and complete tasks.   Health Maintenance: Dental -- Routine Care Vision -- Routine Care  Immunizations -- UTD  PAP --  UTD - Sees Dr. Edward Jolly.  Diet - Keto diet currently Exercise -  She exercises 4 times a week doing weight lifting and cardio    Past Medical History:  Diagnosis Date   ADHD (attention deficit hyperactivity disorder)    ADHD (attention deficit hyperactivity disorder)    Anxiety    Blood transfusion without reported diagnosis    after tonsillectomy   Depression     Past Surgical History:  Procedure Laterality Date   TONSILLECTOMY     TONSILLECTOMY AND ADENOIDECTOMY N/A 12/19/2019   Procedure: CONTROL OF TONSIL BLEED;  Surgeon: Graylin Shiver, MD;  Location: [facility] OR;  Service: ENT;  Laterality: N/A;    Current Outpatient Medications on File Prior to Visit  Medication Sig Dispense Refill   levonorgestrel (MIRENA) 20 MCG/DAY IUD 1 each by Intrauterine route once.     No current facility-administered medications on file prior to visit.    No Known Allergies  No family history on file.  Social History   Socioeconomic History   Marital status: Single    Spouse name: Not on file   Number of children: Not on file   Years of education: Not on file   Highest education level: Not on file  Occupational History   Not on file  Tobacco Use   Smoking status: Never    Passive exposure: Yes   Smokeless tobacco: Never  Vaping Use    Vaping Use: Never used  Substance and Sexual Activity   Alcohol use: No   Drug use: No   Sexual activity: Yes    Birth control/protection: I.U.D.    Comment: Mirena IUD 07/2021  Other Topics Concern   Not on file  Social History Narrative   Lives with mother and brother.   Father and mother seperated.   Do spend some time with the father.   Social Determinants of Health   Financial Resource Strain: Not on file  Food Insecurity: Not on file  Transportation Needs: Not on file  Physical Activity: Not on file  Stress: Not on file  Social Connections: Not on file  Intimate Partner Violence: Not on file    Review of Systems  Constitutional: Negative.   HENT: Negative.    Eyes: Negative.   Respiratory: Negative.    Cardiovascular: Negative.   Gastrointestinal: Negative.   Genitourinary: Negative.   Musculoskeletal: Negative.   Skin: Negative.   Neurological: Negative.   Endo/Heme/Allergies: Negative.   Psychiatric/Behavioral: Negative.    All other systems reviewed and are negative.   There were no vitals taken for this visit.  Physical Exam Vitals and nursing note reviewed.  Constitutional:      General: She is not in acute distress.    Appearance: Normal appearance. She is well-developed. She is not ill-appearing.  HENT:     Head: Normocephalic  and atraumatic.     Right Ear: Tympanic membrane, ear canal and external ear normal. There is no impacted cerumen.     Left Ear: Tympanic membrane, ear canal and external ear normal. There is no impacted cerumen.     Nose: Nose normal. No congestion or rhinorrhea.     Mouth/Throat:     Mouth: Mucous membranes are moist.     Pharynx: Oropharynx is clear. No oropharyngeal exudate or posterior oropharyngeal erythema.  Eyes:     General:        Right eye: No discharge.        Left eye: No discharge.     Extraocular Movements: Extraocular movements intact.     Conjunctiva/sclera: Conjunctivae normal.     Pupils: Pupils are  equal, round, and reactive to light.  Neck:     Thyroid: No thyromegaly.     Vascular: No carotid bruit.     Trachea: No tracheal deviation.  Cardiovascular:     Rate and Rhythm: Normal rate and regular rhythm.     Pulses: Normal pulses.     Heart sounds: Normal heart sounds. No murmur heard.    No friction rub. No gallop.  Pulmonary:     Effort: Pulmonary effort is normal. No respiratory distress.     Breath sounds: Normal breath sounds. No stridor. No wheezing, rhonchi or rales.  Chest:     Chest wall: No tenderness.  Abdominal:     General: Abdomen is flat. Bowel sounds are normal. There is no distension.     Palpations: Abdomen is soft. There is no mass.     Tenderness: There is no abdominal tenderness. There is no right CVA tenderness, left CVA tenderness, guarding or rebound.     Hernia: No hernia is present.  Musculoskeletal:        General: No swelling, tenderness, deformity or signs of injury. Normal range of motion.     Cervical back: Normal range of motion and neck supple.     Right lower leg: No edema.     Left lower leg: No edema.  Lymphadenopathy:     Cervical: No cervical adenopathy.  Skin:    General: Skin is warm and dry.     Coloration: Skin is not jaundiced or pale.     Findings: No bruising, erythema, lesion or rash.  Neurological:     General: No focal deficit present.     Mental Status: She is alert and oriented to person, place, and time.     Cranial Nerves: No cranial nerve deficit.     Sensory: No sensory deficit.     Motor: No weakness.     Coordination: Coordination normal.     Gait: Gait normal.     Deep Tendon Reflexes: Reflexes normal.  Psychiatric:        Mood and Affect: Mood normal.        Behavior: Behavior normal.        Thought Content: Thought content normal.        Judgment: Judgment normal.     Recent Results (from the past 2160 hour(s))  Cervicovaginal ancillary only( [name])     Status: Abnormal   Collection Time:  01/23/22  9:03 AM  Result Value Ref Range   Neisseria Gonorrhea Negative    Chlamydia Negative    Trichomonas Negative    Bacterial Vaginitis (gardnerella) Positive (A)    Candida Vaginitis Negative    Candida Glabrata Negative    Comment  Normal Reference Range Bacterial Vaginosis - Negative   Comment Normal Reference Range Candida Species - Negative    Comment Normal Reference Range Candida Galbrata - Negative    Comment Normal Reference Range Trichomonas - Negative    Comment Normal Reference [place] Chlamydia - Negative    Comment      Normal Reference Range Neisseria Gonorrhea - Negative  Cervicovaginal ancillary only( [place])     Status: None   Collection Time: 03/28/22  9:51 AM  Result Value Ref Range   Trichomonas Negative    Bacterial Vaginitis (gardnerella) Negative    Candida Vaginitis Negative    Candida Glabrata Negative    Comment Normal Reference Range Candida Species - Negative    Comment Normal Reference Range Candida Galbrata - Negative    Comment Normal Reference Range Trichomonas - Negative    Comment      Normal Reference Range Bacterial Vaginosis - Negative  Pregnancy, urine     Status: None   Collection Time: 04/11/22  3:43 PM  Result Value Ref Range   Preg Test, Ur NEGATIVE NEGATIVE    Assessment/Plan: 1. Encounter to establish care - Follow up as needed - Sees GYN yearly   2. Attention deficit hyperactivity disorder (ADHD), predominantly inattentive type -Information given on [facility].      Dorothyann Peng, NP

## 2022-04-13 NOTE — Patient Instructions (Signed)
It was great meeting you today   Please contact [facility] Address: [address] [place], [place], [place] [address] Phone: [phone]

## 2022-04-17 ENCOUNTER — Encounter: Payer: Self-pay | Admitting: Adult Health

## 2022-05-08 ENCOUNTER — Telehealth: Payer: Self-pay

## 2022-05-08 NOTE — Telephone Encounter (Signed)
Please contact patient back in one week to reschedule her IUD check up if she does not call back to do so.   Thank you.

## 2022-05-08 NOTE — Telephone Encounter (Signed)
Kelly Bray, CMA  [facility] Triage Patient called and canceled IUD 4 week check due to being sick. She did not resch at this time stated she will call back.

## 2022-05-09 ENCOUNTER — Ambulatory Visit: Payer: 59 | Admitting: Obstetrics and Gynecology

## 2022-05-14 NOTE — Telephone Encounter (Signed)
Message sent to Kelly Bray to call patient to reschedule since patient has not r/s.

## 2022-05-17 NOTE — Telephone Encounter (Signed)
Patient has been rescheduled to 05/21/22 at noon.

## 2022-05-17 NOTE — Progress Notes (Signed)
GYNECOLOGY  VISIT   HPI: 21 y.o.   Single  Caucasian  female   G0P0000 with No LMP recorded. (Menstrual status: IUD).   here for 4 week ParaGard IUD check.    Has been spotting since the IUD was placed.  Occurring every day.   She is having heavier bleeding now.   No cramps.   Sexually active since the IUD was placed.  She is having some discomfort, which is manageable.   Can feel the strings.   Not feeling groggy in the am now since coming off the Mirena IUD.   She has not been off of hormonal contraception for a long time.   GYNECOLOGIC HISTORY: No LMP recorded. (Menstrual status: IUD). 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/12/2021   IUD (intrauterine device) in place 12/12/2021   Encounter for IUD insertion 05/31/2021   Acute blood loss anemia 12/20/2019   Tonsillar bleed 12/19/2019   Irregular periods 10/09/2019   Acne 12/22/2014   Influenza A (H1N1) 10/14/2013   Cutaneous wart 10/14/2013   ADHD (attention deficit hyperactivity disorder) 02/11/2013   Behavioral problems 02/11/2013    Past Medical History:  Diagnosis Date   ADHD (attention deficit hyperactivity disorder)    ADHD (attention deficit hyperactivity disorder)    Anxiety    Blood transfusion without reported diagnosis    after tonsillectomy   Depression     Past Surgical History:  Procedure Laterality Date   TONSILLECTOMY     TONSILLECTOMY AND ADENOIDECTOMY N/A 12/19/2019   Procedure: CONTROL OF TONSIL BLEED;  Surgeon: Graylin Shiver, MD;  Location: [facility] OR;  Service: ENT;  Laterality: N/A;    No current outpatient medications on file.   No  current facility-administered medications for this visit.     ALLERGIES: Patient has no known allergies.  Family History  Problem Relation Age of Onset   Alcohol abuse Mother    Arthritis Mother    Depression Mother    Mental illness Mother    Miscarriages / [place] Mother    Alcohol abuse Father    Depression Father    Drug abuse Father    Mental illness Father    Heart murmur Brother    Alcohol abuse Maternal Grandmother     Social History   Socioeconomic History   Marital status: Single    Spouse name: Not on file   Number of children: Not on file   Years of education: Not on file   Highest education level: Not on file  Occupational History   Not on file  Tobacco Use   Smoking status: Never    Passive exposure: Yes   Smokeless tobacco: Never  Vaping Use   Vaping Use: Every day  Substance and Sexual Activity   Alcohol use: No   Drug use: No   Sexual activity: Yes    Birth  control/protection: I.U.D.    Comment: Mirena IUD 07/2021  Other Topics Concern   Not on file  Social History Narrative   Works at [employer] with boyfriend    No kids.   Social Determinants of Health   Financial Resource Strain: Not on file  Food Insecurity: Not on file  Transportation Needs: Not on file  Physical Activity: Not on file  Stress: Not on file  Social Connections: Not on file  Intimate Partner Violence: Not on file    Review of Systems  All other systems reviewed and are negative.   PHYSICAL EXAMINATION:    BP 112/72 (BP Location: Left Arm)   Pulse 72   Resp 18     General appearance: alert, cooperative and appears stated age   Pelvic: External genitalia:  no lesions              Urethra:  normal appearing urethra with no masses, tenderness or lesions              Bartholins and Skenes: normal                 Vagina: normal appearing vagina with normal color and discharge, no lesions              Cervix: no lesions.  IUD strings noted, 4 cm  length.  Light menstrual bleeding.                 Bimanual Exam:  Uterus:  normal size, contour, position, consistency, mobility, non-tender              Adnexa: no mass, fullness, tenderness               Chaperone was present for exam:  Noreene Larsson, RN  ASSESSMENT  Paragard IUD.  Abnormal uterine bleeding.   PLAN  Reassurance regarding IUD.  We talked about reasons for irregular bleeding with a nonhormonal IUD:  pregnancy, STD infection, malposition of the IUD, and hormonal irregularity.  Will check UPT:  negative.  Patient would like to do a course of Provera 10 mg x 10 days. If bleeding irregularity persists, she will return and we will consider additional evaluation such as STD screening, pelvic US to check the IUD, and hormonal testing.    Fu prn.    An After Visit Summary was printed and given to the patient.  21 min  total time was spent for this patient encounter, including preparation, face-to-face counseling with the patient, coordination of care, and documentation of the encounter.

## 2022-05-21 ENCOUNTER — Encounter: Payer: Self-pay | Admitting: Obstetrics and Gynecology

## 2022-05-21 ENCOUNTER — Ambulatory Visit (INDEPENDENT_AMBULATORY_CARE_PROVIDER_SITE_OTHER): Payer: 59 | Admitting: Obstetrics and Gynecology

## 2022-05-21 VITALS — BP 112/72 | HR 72 | Resp 18

## 2022-05-21 DIAGNOSIS — Z30431 Encounter for routine checking of intrauterine contraceptive device: Secondary | ICD-10-CM

## 2022-05-21 DIAGNOSIS — N939 Abnormal uterine and vaginal bleeding, unspecified: Secondary | ICD-10-CM

## 2022-05-21 LAB — PREGNANCY, URINE: Preg Test, Ur: NEGATIVE

## 2022-05-21 MED ORDER — MEDROXYPROGESTERONE ACETATE 10 MG PO TABS: 10.0000 mg | ORAL_TABLET | Freq: Every day | ORAL | 0 refills | Status: DC

## 2022-06-20 DIAGNOSIS — F332 Major depressive disorder, recurrent severe without psychotic features: Secondary | ICD-10-CM | POA: Diagnosis not present

## 2022-06-20 DIAGNOSIS — F900 Attention-deficit hyperactivity disorder, predominantly inattentive type: Secondary | ICD-10-CM | POA: Diagnosis not present

## 2022-06-27 DIAGNOSIS — F332 Major depressive disorder, recurrent severe without psychotic features: Secondary | ICD-10-CM | POA: Diagnosis not present

## 2022-06-27 DIAGNOSIS — F411 Generalized anxiety disorder: Secondary | ICD-10-CM | POA: Diagnosis not present

## 2022-06-27 DIAGNOSIS — F900 Attention-deficit hyperactivity disorder, predominantly inattentive type: Secondary | ICD-10-CM | POA: Diagnosis not present

## 2022-06-27 DIAGNOSIS — F331 Major depressive disorder, recurrent, moderate: Secondary | ICD-10-CM | POA: Diagnosis not present

## 2022-07-04 DIAGNOSIS — F331 Major depressive disorder, recurrent, moderate: Secondary | ICD-10-CM | POA: Diagnosis not present

## 2022-07-04 DIAGNOSIS — F332 Major depressive disorder, recurrent severe without psychotic features: Secondary | ICD-10-CM | POA: Diagnosis not present

## 2022-07-04 DIAGNOSIS — F411 Generalized anxiety disorder: Secondary | ICD-10-CM | POA: Diagnosis not present

## 2022-07-04 DIAGNOSIS — F900 Attention-deficit hyperactivity disorder, predominantly inattentive type: Secondary | ICD-10-CM | POA: Diagnosis not present

## 2022-07-11 DIAGNOSIS — F332 Major depressive disorder, recurrent severe without psychotic features: Secondary | ICD-10-CM | POA: Diagnosis not present

## 2022-07-11 DIAGNOSIS — F900 Attention-deficit hyperactivity disorder, predominantly inattentive type: Secondary | ICD-10-CM | POA: Diagnosis not present

## 2022-07-11 DIAGNOSIS — F411 Generalized anxiety disorder: Secondary | ICD-10-CM | POA: Diagnosis not present

## 2022-07-11 DIAGNOSIS — F331 Major depressive disorder, recurrent, moderate: Secondary | ICD-10-CM | POA: Diagnosis not present

## 2022-07-24 DIAGNOSIS — F411 Generalized anxiety disorder: Secondary | ICD-10-CM | POA: Diagnosis not present

## 2022-07-24 DIAGNOSIS — F332 Major depressive disorder, recurrent severe without psychotic features: Secondary | ICD-10-CM | POA: Diagnosis not present

## 2022-07-25 DIAGNOSIS — F411 Generalized anxiety disorder: Secondary | ICD-10-CM | POA: Diagnosis not present

## 2022-07-25 DIAGNOSIS — F900 Attention-deficit hyperactivity disorder, predominantly inattentive type: Secondary | ICD-10-CM | POA: Diagnosis not present

## 2022-07-25 DIAGNOSIS — F332 Major depressive disorder, recurrent severe without psychotic features: Secondary | ICD-10-CM | POA: Diagnosis not present

## 2022-07-25 DIAGNOSIS — F331 Major depressive disorder, recurrent, moderate: Secondary | ICD-10-CM | POA: Diagnosis not present

## 2022-08-01 DIAGNOSIS — F331 Major depressive disorder, recurrent, moderate: Secondary | ICD-10-CM | POA: Diagnosis not present

## 2022-08-01 DIAGNOSIS — F411 Generalized anxiety disorder: Secondary | ICD-10-CM | POA: Diagnosis not present

## 2022-08-01 DIAGNOSIS — F900 Attention-deficit hyperactivity disorder, predominantly inattentive type: Secondary | ICD-10-CM | POA: Diagnosis not present

## 2022-08-01 DIAGNOSIS — F332 Major depressive disorder, recurrent severe without psychotic features: Secondary | ICD-10-CM | POA: Diagnosis not present

## 2022-08-03 ENCOUNTER — Ambulatory Visit (INDEPENDENT_AMBULATORY_CARE_PROVIDER_SITE_OTHER): Payer: Medicaid Other | Admitting: Obstetrics & Gynecology

## 2022-08-03 ENCOUNTER — Encounter: Payer: Self-pay | Admitting: Obstetrics & Gynecology

## 2022-08-03 VITALS — BP 108/80 | HR 82

## 2022-08-03 DIAGNOSIS — L700 Acne vulgaris: Secondary | ICD-10-CM | POA: Diagnosis not present

## 2022-08-03 DIAGNOSIS — N921 Excessive and frequent menstruation with irregular cycle: Secondary | ICD-10-CM | POA: Diagnosis not present

## 2022-08-03 DIAGNOSIS — N913 Primary oligomenorrhea: Secondary | ICD-10-CM | POA: Diagnosis not present

## 2022-08-03 DIAGNOSIS — Z975 Presence of (intrauterine) contraceptive device: Secondary | ICD-10-CM

## 2022-08-03 DIAGNOSIS — N914 Secondary oligomenorrhea: Secondary | ICD-10-CM | POA: Diagnosis not present

## 2022-08-03 MED ORDER — MEDROXYPROGESTERONE ACETATE 5 MG PO TABS: 5.0000 mg | ORAL_TABLET | Freq: Every day | ORAL | 4 refills | Status: AC

## 2022-08-03 NOTE — Progress Notes (Signed)
    Kelly Bray May 18, 2001 [id number]        21 y.o.  G0P0000 Single/Boyfriend  RP: BTB/Oligomenorrhea on Paragard IUD  HPI: Oligomenorrhea during teenage for which patient was started on BCPs, then DepoProvera, then Mirena IUD.  Because of low moods, patient requested to be on a non-hormonal contraceptive.  Mirena IUD removed and Paragard IUD inserted on 04/11/22 by Dr Quincy Simmonds.  Pelvic US in 11/2018 showed the typical Polycystic Ovary pattern with bilateral ovarian enlargement and peripheral multi follicular appearance, especially on the Left Ovary.  Patient is currently having brownish spotting most days.  Moderate facial acne.  No abnormal vaginal discharge, no pelvic pain.  No fever.  Using condoms with IC.  Urine/BMs normal.   OB History  Gravida Para Term Preterm AB Living  0 0 0 0 0 0  SAB IAB Ectopic Multiple Live Births  0 0 0 0 0    Past medical history,surgical history, problem list, medications, allergies, family history and social history were all reviewed and documented in the EPIC chart.   Directed ROS with pertinent positives and negatives documented in the history of present illness/assessment and plan.  Exam:  Vitals:   08/03/22 0812  BP: 108/80  Pulse: 82  SpO2: 98%   General appearance:  Normal  Facial acne  Abdomen: Normal  Gynecologic exam: Vulva normal.  Speculum:  Cervix normal.  IUD strings visible at EO.  Vagina normal. Brownish discharge present.  Sure Swab Advance done.  Bimanual exam:  Uterus AV, normal volume, mobile, NT.  No adnexal mass, NT.   Assessment/Plan:  21 y.o. G0  1. Breakthrough bleeding associated with intrauterine device (IUD) Oligomenorrhea during teenage for which patient was started on BCPs, then DepoProvera, then Mirena IUD.  Because of low moods, patient requested to be on a non-hormonal contraceptive.  Mirena IUD removed and Paragard IUD inserted on 04/11/22 by Dr Quincy Simmonds.  Pelvic US in 11/2018 showed the typical Polycystic  Ovary pattern with bilateral ovarian enlargement and peripheral multi follicular appearance, especially on the Left Ovary.  Patient is currently having brownish spotting most days.  Moderate facial acne.  No abnormal vaginal discharge, no pelvic pain.  No fever.  Using condoms with IC.  Urine/BMs normal.  Normal Gyn exam today.  IUD in good location.  BTB probably associated with PCOS/Chronic anovulation or micro-ovulation.  Will r/o an IUD lesion with a Pelvic US at f/u with Dr Quincy Simmonds.  R/O STI with SureSwab Advanced. 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  Will repeat a Pelvic US at f/u.  Lab work today.  At this point patient prefers not being on the [facility], would recommend Yaz because of acne.  Will protect the Endometrium/Attempt controlling the frequent BTB with cyclic Provera 5 mg x 12 days every 3 months.  Patient explained usage, prescription sent to pharmacy. - TSH - Prolactin - FSH - HgB A1c - Testos,Total,Free and SHBG (Female) - US Transvaginal Non-OB; Future  3. Acne vulgaris R/O elevated Testo.  Probably associated with PCOS. - Testos,Total,Free and SHBG (Female)  Other orders - escitalopram (LEXAPRO) 5 MG tablet; Take 5 mg by mouth daily. - medroxyPROGESTERone (PROVERA) 5 MG tablet; Take 1 tablet (5 mg total) by mouth daily for 12 days. Cyclic Provera 5 mg daily x 12 days every 3 months if no spontaneous menstrual period.   Princess Bruins MD, 8:15 AM 08/03/2022

## 2022-08-04 LAB — SURESWAB® ADVANCED VAGINITIS PLUS,TMA
C. trachomatis RNA, TMA: NOT DETECTED
CANDIDA SPECIES: NOT DETECTED
Candida glabrata: NOT DETECTED
N. gonorrhoeae RNA, TMA: NOT DETECTED
SURESWAB(R) ADV BACTERIAL VAGINOSIS(BV),TMA: NEGATIVE
TRICHOMONAS VAGINALIS (TV),TMA: NOT DETECTED

## 2022-08-08 DIAGNOSIS — F411 Generalized anxiety disorder: Secondary | ICD-10-CM | POA: Diagnosis not present

## 2022-08-08 DIAGNOSIS — F331 Major depressive disorder, recurrent, moderate: Secondary | ICD-10-CM | POA: Diagnosis not present

## 2022-08-08 DIAGNOSIS — F332 Major depressive disorder, recurrent severe without psychotic features: Secondary | ICD-10-CM | POA: Diagnosis not present

## 2022-08-08 DIAGNOSIS — F900 Attention-deficit hyperactivity disorder, predominantly inattentive type: Secondary | ICD-10-CM | POA: Diagnosis not present

## 2022-08-08 LAB — TSH: TSH: 3.27 mIU/L

## 2022-08-08 LAB — HEMOGLOBIN A1C
Hgb A1c MFr Bld: 5.4 % of total Hgb (ref <5.7)
Mean Plasma Glucose: 108 mg/dL
eAG (mmol/L): 6 mmol/L

## 2022-08-08 LAB — TESTOS,TOTAL,FREE AND SHBG (FEMALE)
Free Testosterone: 7.3 pg/mL — ABNORMAL HIGH (ref 0.1–6.4)
Sex Hormone Binding: 27.5 nmol/L (ref 17–124)
Testosterone, Total, LC-MS-MS: 50 ng/dL — ABNORMAL HIGH (ref 2–45)

## 2022-08-08 LAB — PROLACTIN: Prolactin: 7.5 ng/mL

## 2022-08-08 LAB — FOLLICLE STIMULATING HORMONE: FSH: 6.6 m[IU]/mL

## 2022-08-14 DIAGNOSIS — F411 Generalized anxiety disorder: Secondary | ICD-10-CM | POA: Diagnosis not present

## 2022-08-14 DIAGNOSIS — F900 Attention-deficit hyperactivity disorder, predominantly inattentive type: Secondary | ICD-10-CM | POA: Diagnosis not present

## 2022-08-14 DIAGNOSIS — F332 Major depressive disorder, recurrent severe without psychotic features: Secondary | ICD-10-CM | POA: Diagnosis not present

## 2022-08-15 DIAGNOSIS — F331 Major depressive disorder, recurrent, moderate: Secondary | ICD-10-CM | POA: Diagnosis not present

## 2022-08-15 DIAGNOSIS — F411 Generalized anxiety disorder: Secondary | ICD-10-CM | POA: Diagnosis not present

## 2022-08-15 DIAGNOSIS — F900 Attention-deficit hyperactivity disorder, predominantly inattentive type: Secondary | ICD-10-CM | POA: Diagnosis not present

## 2022-08-15 DIAGNOSIS — F332 Major depressive disorder, recurrent severe without psychotic features: Secondary | ICD-10-CM | POA: Diagnosis not present

## 2022-09-03 NOTE — Progress Notes (Deleted)
GYNECOLOGY  VISIT   HPI: 21 y.o.   Single  Caucasian  female   G0P0000 with No LMP recorded. (Menstrual status: IUD).   here for   U/S consult.  GYNECOLOGIC HISTORY: No LMP recorded. (Menstrual status: IUD). Contraception:    Paragard IUD 04-11-22  Menopausal hormone therapy:  n/a Last mammogram:  n/a Last pap smear:   n/a        OB History     Gravida  0   Para  0   Term  0   Preterm  0   AB  0   Living  0      SAB  0   IAB  0   Ectopic  0   Multiple  0   Live Births  0              Patient Active Problem List   Diagnosis Date Noted   Secondary amenorrhea 12/12/2021   IUD (intrauterine device) in place 12/12/2021   Encounter for IUD insertion 05/31/2021   Acute blood loss anemia 12/20/2019   Tonsillar bleed 12/19/2019   Irregular periods 10/09/2019   Acne 12/22/2014   Influenza A (H1N1) 10/14/2013   Cutaneous wart 10/14/2013   ADHD (attention deficit hyperactivity disorder) 02/11/2013   Behavioral problems 02/11/2013    Past Medical History:  Diagnosis Date   ADHD (attention deficit hyperactivity disorder)    ADHD (attention deficit hyperactivity disorder)    Anxiety    Blood transfusion without reported diagnosis    after tonsillectomy   Depression     Past Surgical History:  Procedure Laterality Date   TONSILLECTOMY     TONSILLECTOMY AND ADENOIDECTOMY N/A 12/19/2019   Procedure: CONTROL OF TONSIL BLEED;  Surgeon: Graylin Shiver, MD;  Location: [facility] OR;  Service: ENT;  Laterality: N/A;    Current Outpatient Medications  Medication Sig Dispense Refill   escitalopram (LEXAPRO) 5 MG tablet Take 5 mg by mouth daily.     medroxyPROGESTERone (PROVERA) 5 MG tablet Take 1 tablet (5 mg total) by mouth daily for 12 days. Cyclic Provera 5 mg daily x 12 days every 3 months if no spontaneous menstrual period. 12 tablet 4   No current facility-administered medications for this visit.     ALLERGIES: Patient has no known allergies.  Family  History  Problem Relation Age of Onset   Alcohol abuse Mother    Arthritis Mother    Depression Mother    Mental illness Mother    Miscarriages / [place] Mother    Alcohol abuse Father    Depression Father    Drug abuse Father    Mental illness Father    Heart murmur Brother    Alcohol abuse Maternal Grandmother     Social History   Socioeconomic History   Marital status: Single    Spouse name: Not on file   Number of children: Not on file   Years of education: Not on file   Highest education level: Not on file  Occupational History   Not on file  Tobacco Use   Smoking status: Never    Passive exposure: Yes   Smokeless tobacco: Never  Vaping Use   Vaping Use: Every day  Substance and Sexual Activity   Alcohol use: No   Drug use: No   Sexual activity: Yes    Partners: Male    Birth control/protection: I.U.D., Condom    Comment: paragard inserted 04-11-22  Other Topics Concern   Not on file  Social History Narrative   Works at [employer] with boyfriend    No kids.   Social Determinants of Health   Financial Resource Strain: Not on file  Food Insecurity: Not on file  Transportation Needs: Not on file  Physical Activity: Not on file  Stress: Not on file  Social Connections: Not on file  Intimate Partner Violence: Not on file    Review of Systems  PHYSICAL EXAMINATION:    There were no vitals taken for this visit.    General appearance: alert, cooperative and appears stated age Head: Normocephalic, without obvious abnormality, atraumatic Neck: no adenopathy, supple, symmetrical, trachea midline and thyroid normal to inspection and palpation Lungs: clear to auscultation bilaterally Breasts: normal appearance, no masses or tenderness, No nipple retraction or dimpling, No nipple discharge or bleeding, No axillary or supraclavicular adenopathy Heart: regular rate and rhythm Abdomen: soft, non-tender, no masses,  no organomegaly Extremities:  extremities normal, atraumatic, no cyanosis or edema Skin: Skin color, texture, turgor normal. No rashes or lesions Lymph nodes: Cervical, supraclavicular, and axillary nodes normal. No abnormal inguinal nodes palpated Neurologic: Grossly normal  Pelvic: External genitalia:  no lesions              Urethra:  normal appearing urethra with no masses, tenderness or lesions              Bartholins and Skenes: normal                 Vagina: normal appearing vagina with normal color and discharge, no lesions              Cervix: no lesions                Bimanual Exam:  Uterus:  normal size, contour, position, consistency, mobility, non-tender              Adnexa: no mass, fullness, tenderness              Rectal exam: {yes no:[id number]}.  Confirms.              Anus:  normal sphincter tone, no lesions  Chaperone was present for exam:  ***  ASSESSMENT     PLAN     An After Visit Summary was printed and given to the patient.  ______ minutes face to face time of which over 50% was spent in counseling.

## 2022-09-06 ENCOUNTER — Other Ambulatory Visit: Payer: Medicaid Other

## 2022-09-06 ENCOUNTER — Other Ambulatory Visit: Payer: Medicaid Other | Admitting: Obstetrics and Gynecology

## 2022-09-12 DIAGNOSIS — F331 Major depressive disorder, recurrent, moderate: Secondary | ICD-10-CM | POA: Diagnosis not present

## 2022-09-12 DIAGNOSIS — F411 Generalized anxiety disorder: Secondary | ICD-10-CM | POA: Diagnosis not present

## 2022-09-12 DIAGNOSIS — F332 Major depressive disorder, recurrent severe without psychotic features: Secondary | ICD-10-CM | POA: Diagnosis not present

## 2022-09-12 DIAGNOSIS — F900 Attention-deficit hyperactivity disorder, predominantly inattentive type: Secondary | ICD-10-CM | POA: Diagnosis not present

## 2022-09-14 ENCOUNTER — Encounter: Payer: Self-pay | Admitting: Obstetrics and Gynecology

## 2022-09-19 DIAGNOSIS — F332 Major depressive disorder, recurrent severe without psychotic features: Secondary | ICD-10-CM | POA: Diagnosis not present

## 2022-09-19 DIAGNOSIS — F331 Major depressive disorder, recurrent, moderate: Secondary | ICD-10-CM | POA: Diagnosis not present

## 2022-09-19 DIAGNOSIS — F411 Generalized anxiety disorder: Secondary | ICD-10-CM | POA: Diagnosis not present

## 2022-09-19 DIAGNOSIS — F900 Attention-deficit hyperactivity disorder, predominantly inattentive type: Secondary | ICD-10-CM | POA: Diagnosis not present

## 2022-10-02 ENCOUNTER — Telehealth: Payer: Self-pay | Admitting: Obstetrics and Gynecology

## 2022-10-02 NOTE — Telephone Encounter (Signed)
Patient no show for her ultrasound appointment on December 7. Message was left on December 11 & 13 and letter mailed on December 15 for patient to rescheduled her missed appointment. Patient has not rescheduled.

## 2022-10-04 DIAGNOSIS — F332 Major depressive disorder, recurrent severe without psychotic features: Secondary | ICD-10-CM | POA: Diagnosis not present

## 2022-10-04 DIAGNOSIS — F331 Major depressive disorder, recurrent, moderate: Secondary | ICD-10-CM | POA: Diagnosis not present

## 2022-10-04 DIAGNOSIS — F900 Attention-deficit hyperactivity disorder, predominantly inattentive type: Secondary | ICD-10-CM | POA: Diagnosis not present

## 2022-10-04 DIAGNOSIS — F411 Generalized anxiety disorder: Secondary | ICD-10-CM | POA: Diagnosis not present

## 2022-10-15 NOTE — Telephone Encounter (Signed)
Left message to call Sharee Pimple, RN at [facility], [phone], OPT 5.  PUS for BTB on IUD/PCOS  Has f/u OV with Dr. Quincy Simmonds on 11/05/22.

## 2022-10-22 NOTE — Progress Notes (Deleted)
GYNECOLOGY  VISIT   HPI: 22 y.o.   Single  Caucasian  female   G0P0000 with No LMP recorded. (Menstrual status: IUD).   here for   3 month f/u  GYNECOLOGIC HISTORY: No LMP recorded. (Menstrual status: IUD). 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/12/2021   IUD (intrauterine device) in place 12/12/2021   Encounter for IUD insertion 05/31/2021   Acute blood loss anemia 12/20/2019   Tonsillar bleed 12/19/2019   Irregular periods 10/09/2019   Acne 12/22/2014   Influenza A (H1N1) 10/14/2013   Cutaneous wart 10/14/2013   ADHD (attention deficit hyperactivity disorder) 02/11/2013   Behavioral problems 02/11/2013    Past Medical History:  Diagnosis Date   ADHD (attention deficit hyperactivity disorder)    ADHD (attention deficit hyperactivity disorder)    Anxiety    Blood transfusion without reported diagnosis    after tonsillectomy   Depression     Past Surgical History:  Procedure Laterality Date   TONSILLECTOMY     TONSILLECTOMY AND ADENOIDECTOMY N/A 12/19/2019   Procedure: CONTROL OF TONSIL BLEED;  Surgeon: Helayne Seminole, MD;  Location: [facility] OR;  Service: ENT;  Laterality: N/A;    Current Outpatient Medications  Medication Sig Dispense Refill   escitalopram (LEXAPRO) 5 MG tablet Take 5 mg by mouth daily.     medroxyPROGESTERone (PROVERA) 5 MG tablet Take 1 tablet (5 mg total) by mouth daily for 12 days. Cyclic Provera 5 mg daily x 12 days every 3 months if no spontaneous menstrual period. 12 tablet 4   No current facility-administered medications for this visit.     ALLERGIES: Patient has no known allergies.  Family  History  Problem Relation Age of Onset   Alcohol abuse Mother    Arthritis Mother    Depression Mother    Mental illness Mother    Miscarriages / [place] Mother    Alcohol abuse Father    Depression Father    Drug abuse Father    Mental illness Father    Heart murmur Brother    Alcohol abuse Maternal Grandmother     Social History   Socioeconomic History   Marital status: Single    Spouse name: Not on file   Number of children: Not on file   Years of education: Not on file   Highest education level: Not on file  Occupational History   Not on file  Tobacco Use   Smoking status: Never    Passive exposure: Yes   Smokeless tobacco: Never  Vaping Use   Vaping Use: Every day  Substance and Sexual Activity   Alcohol use: No   Drug use: No   Sexual activity: Yes    Partners: Male    Birth control/protection: I.U.D., Condom    Comment: paragard inserted 04-11-22  Other Topics Concern   Not on file  Social History  Narrative   Works at [employer] with boyfriend    No kids.   Social Determinants of Health   Financial Resource Strain: Not on file  Food Insecurity: Not on file  Transportation Needs: Not on file  Physical Activity: Not on file  Stress: Not on file  Social Connections: Not on file  Intimate Partner Violence: Not on file    Review of Systems  PHYSICAL EXAMINATION:    There were no vitals taken for this visit.    General appearance: alert, cooperative and appears stated age Head: Normocephalic, without obvious abnormality, atraumatic Neck: no adenopathy, supple, symmetrical, trachea midline and thyroid normal to inspection and palpation Lungs: clear to auscultation bilaterally Breasts: normal appearance, no masses or tenderness, No nipple retraction or dimpling, No nipple discharge or bleeding, No axillary or supraclavicular adenopathy Heart: regular rate and rhythm Abdomen: soft, non-tender, no masses,  no organomegaly Extremities:  extremities normal, atraumatic, no cyanosis or edema Skin: Skin color, texture, turgor normal. No rashes or lesions Lymph nodes: Cervical, supraclavicular, and axillary nodes normal. No abnormal inguinal nodes palpated Neurologic: Grossly normal  Pelvic: External genitalia:  no lesions              Urethra:  normal appearing urethra with no masses, tenderness or lesions              Bartholins and Skenes: normal                 Vagina: normal appearing vagina with normal color and discharge, no lesions              Cervix: no lesions                Bimanual Exam:  Uterus:  normal size, contour, position, consistency, mobility, non-tender              Adnexa: no mass, fullness, tenderness              Rectal exam: {yes no:314532}.  Confirms.              Anus:  normal sphincter tone, no lesions  Chaperone was present for exam:  ***  ASSESSMENT     PLAN     An After Visit Summary was printed and given to the patient.  ______ minutes face to face time of which over 50% was spent in counseling.

## 2022-10-25 DIAGNOSIS — F332 Major depressive disorder, recurrent severe without psychotic features: Secondary | ICD-10-CM | POA: Diagnosis not present

## 2022-10-25 DIAGNOSIS — F900 Attention-deficit hyperactivity disorder, predominantly inattentive type: Secondary | ICD-10-CM | POA: Diagnosis not present

## 2022-10-25 DIAGNOSIS — F411 Generalized anxiety disorder: Secondary | ICD-10-CM | POA: Diagnosis not present

## 2022-10-25 DIAGNOSIS — F331 Major depressive disorder, recurrent, moderate: Secondary | ICD-10-CM | POA: Diagnosis not present

## 2022-10-31 ENCOUNTER — Encounter: Payer: Self-pay | Admitting: Obstetrics and Gynecology

## 2022-10-31 ENCOUNTER — Telehealth: Payer: Self-pay

## 2022-10-31 NOTE — Telephone Encounter (Signed)
I recommend she go to the emergency department if she is having ongoing heavy bleeding with pad or tampon change hourly.

## 2022-10-31 NOTE — Telephone Encounter (Signed)
Pt calling to report heavy bleeding w/ dime sized clots for the past 3 days. Reports soaking through a super tampon ~q1hr. Also reports somewhat manageable pelvic discomfort. Denies any signs of anemia at this time.   Pt is requesting an OV today/tomorrow.   Paragard IUD placed on 04/11/2022  FYI. Per telephone encounter from 10/02/2022: "Patient no show for her ultrasound appointment on December 7. Message was left on December 11 & 13 and letter mailed on December 15 for patient to rescheduled her missed appointment. Patient has not rescheduled."   Pt currently scheduled to see you for f/u on 11/07/2022. Please advise.

## 2022-10-31 NOTE — Telephone Encounter (Signed)
FYI. Pt notified and voiced understanding. Also advised to keep appt with you on 11/07/22 @ 8am.

## 2022-11-01 NOTE — Telephone Encounter (Signed)
Encounter reviewed and closed.  

## 2022-11-01 NOTE — Telephone Encounter (Signed)
Call returned to patient. Left detailed message, ok per dpr. Advised patient to keep OV as scheduled with Dr. Quincy Simmonds for 11/07/22 for further evaluation. Return call to office if any additional questions.   Cancelled current PUS order placed by Dr. Dellis Filbert. Can reorder if needed during OV on 11/07/22.   See telephone encounter dated 10/31/22.   Routing to provider for final review. Patient is agreeable to disposition. Will close encounter.

## 2022-11-02 DIAGNOSIS — F332 Major depressive disorder, recurrent severe without psychotic features: Secondary | ICD-10-CM | POA: Diagnosis not present

## 2022-11-02 DIAGNOSIS — F331 Major depressive disorder, recurrent, moderate: Secondary | ICD-10-CM | POA: Diagnosis not present

## 2022-11-02 DIAGNOSIS — F411 Generalized anxiety disorder: Secondary | ICD-10-CM | POA: Diagnosis not present

## 2022-11-02 DIAGNOSIS — F900 Attention-deficit hyperactivity disorder, predominantly inattentive type: Secondary | ICD-10-CM | POA: Diagnosis not present

## 2022-11-05 ENCOUNTER — Ambulatory Visit: Payer: Medicaid Other | Admitting: Obstetrics and Gynecology

## 2022-11-05 NOTE — Progress Notes (Deleted)
GYNECOLOGY  VISIT   HPI: 22 y.o.   Single  Caucasian  female   G0P0000 with No LMP recorded. (Menstrual status: IUD).   here for   3 mo recheck  GYNECOLOGIC HISTORY: No LMP recorded. (Menstrual status: IUD). 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/12/2021   IUD (intrauterine device) in place 12/12/2021   Encounter for IUD insertion 05/31/2021   Acute blood loss anemia 12/20/2019   Tonsillar bleed 12/19/2019   Irregular periods 10/09/2019   Acne 12/22/2014   Influenza A (H1N1) 10/14/2013   Cutaneous wart 10/14/2013   ADHD (attention deficit hyperactivity disorder) 02/11/2013   Behavioral problems 02/11/2013    Past Medical History:  Diagnosis Date   ADHD (attention deficit hyperactivity disorder)    ADHD (attention deficit hyperactivity disorder)    Anxiety    Blood transfusion without reported diagnosis    after tonsillectomy   Depression     Past Surgical History:  Procedure Laterality Date   TONSILLECTOMY     TONSILLECTOMY AND ADENOIDECTOMY N/A 12/19/2019   Procedure: CONTROL OF TONSIL BLEED;  Surgeon: Helayne Seminole, MD;  Location: [facility] OR;  Service: ENT;  Laterality: N/A;    Current Outpatient Medications  Medication Sig Dispense Refill   escitalopram (LEXAPRO) 5 MG tablet Take 5 mg by mouth daily.     medroxyPROGESTERone (PROVERA) 5 MG tablet Take 1 tablet (5 mg total) by mouth daily for 12 days. Cyclic Provera 5 mg daily x 12 days every 3 months if no spontaneous menstrual period. 12 tablet 4   No current facility-administered medications for this visit.     ALLERGIES: Patient has no known allergies.  Family  History  Problem Relation Age of Onset   Alcohol abuse Mother    Arthritis Mother    Depression Mother    Mental illness Mother    Miscarriages / [place] Mother    Alcohol abuse Father    Depression Father    Drug abuse Father    Mental illness Father    Heart murmur Brother    Alcohol abuse Maternal Grandmother     Social History   Socioeconomic History   Marital status: Single    Spouse name: Not on file   Number of children: Not on file   Years of education: Not on file   Highest education level: Not on file  Occupational History   Not on file  Tobacco Use   Smoking status: Never    Passive exposure: Yes   Smokeless tobacco: Never  Vaping Use   Vaping Use: Every day  Substance and Sexual Activity   Alcohol use: No   Drug use: No   Sexual activity: Yes    Partners: Male    Birth control/protection: I.U.D., Condom    Comment: paragard inserted 04-11-22  Other Topics Concern   Not on file  Social History  Narrative   Works at [employer] with boyfriend    No kids.   Social Determinants of Health   Financial Resource Strain: Not on file  Food Insecurity: Not on file  Transportation Needs: Not on file  Physical Activity: Not on file  Stress: Not on file  Social Connections: Not on file  Intimate Partner Violence: Not on file    Review of Systems  PHYSICAL EXAMINATION:    There were no vitals taken for this visit.    General appearance: alert, cooperative and appears stated age Head: Normocephalic, without obvious abnormality, atraumatic Neck: no adenopathy, supple, symmetrical, trachea midline and thyroid normal to inspection and palpation Lungs: clear to auscultation bilaterally Breasts: normal appearance, no masses or tenderness, No nipple retraction or dimpling, No nipple discharge or bleeding, No axillary or supraclavicular adenopathy Heart: regular rate and rhythm Abdomen: soft, non-tender, no masses,  no organomegaly Extremities:  extremities normal, atraumatic, no cyanosis or edema Skin: Skin color, texture, turgor normal. No rashes or lesions Lymph nodes: Cervical, supraclavicular, and axillary nodes normal. No abnormal inguinal nodes palpated Neurologic: Grossly normal  Pelvic: External genitalia:  no lesions              Urethra:  normal appearing urethra with no masses, tenderness or lesions              Bartholins and Skenes: normal                 Vagina: normal appearing vagina with normal color and discharge, no lesions              Cervix: no lesions                Bimanual Exam:  Uterus:  normal size, contour, position, consistency, mobility, non-tender              Adnexa: no mass, fullness, tenderness              Rectal exam: {yes no:314532}.  Confirms.              Anus:  normal sphincter tone, no lesions  Chaperone was present for exam:  ***  ASSESSMENT     PLAN     An After Visit Summary was printed and given to the patient.  ______ minutes face to face time of which over 50% was spent in counseling.

## 2022-11-07 ENCOUNTER — Ambulatory Visit: Payer: Medicaid Other | Admitting: Obstetrics and Gynecology

## 2022-12-05 DIAGNOSIS — F900 Attention-deficit hyperactivity disorder, predominantly inattentive type: Secondary | ICD-10-CM | POA: Diagnosis not present

## 2022-12-05 DIAGNOSIS — F332 Major depressive disorder, recurrent severe without psychotic features: Secondary | ICD-10-CM | POA: Diagnosis not present

## 2022-12-05 DIAGNOSIS — F411 Generalized anxiety disorder: Secondary | ICD-10-CM | POA: Diagnosis not present

## 2022-12-12 DIAGNOSIS — F331 Major depressive disorder, recurrent, moderate: Secondary | ICD-10-CM | POA: Diagnosis not present

## 2022-12-12 DIAGNOSIS — F411 Generalized anxiety disorder: Secondary | ICD-10-CM | POA: Diagnosis not present

## 2022-12-12 DIAGNOSIS — F332 Major depressive disorder, recurrent severe without psychotic features: Secondary | ICD-10-CM | POA: Diagnosis not present

## 2022-12-12 DIAGNOSIS — F900 Attention-deficit hyperactivity disorder, predominantly inattentive type: Secondary | ICD-10-CM | POA: Diagnosis not present

## 2022-12-23 DIAGNOSIS — F332 Major depressive disorder, recurrent severe without psychotic features: Secondary | ICD-10-CM | POA: Diagnosis not present

## 2022-12-23 DIAGNOSIS — F900 Attention-deficit hyperactivity disorder, predominantly inattentive type: Secondary | ICD-10-CM | POA: Diagnosis not present

## 2022-12-23 DIAGNOSIS — F331 Major depressive disorder, recurrent, moderate: Secondary | ICD-10-CM | POA: Diagnosis not present

## 2022-12-23 DIAGNOSIS — F411 Generalized anxiety disorder: Secondary | ICD-10-CM | POA: Diagnosis not present

## 2022-12-28 DIAGNOSIS — F411 Generalized anxiety disorder: Secondary | ICD-10-CM | POA: Diagnosis not present

## 2022-12-28 DIAGNOSIS — F332 Major depressive disorder, recurrent severe without psychotic features: Secondary | ICD-10-CM | POA: Diagnosis not present

## 2023-04-12 DIAGNOSIS — F5081 Binge eating disorder: Secondary | ICD-10-CM | POA: Diagnosis not present

## 2023-04-12 DIAGNOSIS — F411 Generalized anxiety disorder: Secondary | ICD-10-CM | POA: Diagnosis not present

## 2023-04-12 DIAGNOSIS — F331 Major depressive disorder, recurrent, moderate: Secondary | ICD-10-CM | POA: Diagnosis not present

## 2023-04-12 DIAGNOSIS — F900 Attention-deficit hyperactivity disorder, predominantly inattentive type: Secondary | ICD-10-CM | POA: Diagnosis not present

## 2023-06-07 DIAGNOSIS — F900 Attention-deficit hyperactivity disorder, predominantly inattentive type: Secondary | ICD-10-CM | POA: Diagnosis not present

## 2023-06-07 DIAGNOSIS — F411 Generalized anxiety disorder: Secondary | ICD-10-CM | POA: Diagnosis not present

## 2023-06-07 DIAGNOSIS — F332 Major depressive disorder, recurrent severe without psychotic features: Secondary | ICD-10-CM | POA: Diagnosis not present

## 2023-06-07 DIAGNOSIS — F5081 Binge eating disorder: Secondary | ICD-10-CM | POA: Diagnosis not present

## 2023-07-29 ENCOUNTER — Ambulatory Visit (INDEPENDENT_AMBULATORY_CARE_PROVIDER_SITE_OTHER): Payer: 59 | Admitting: Physician Assistant

## 2023-07-29 DIAGNOSIS — Z91199 Patient's noncompliance with other medical treatment and regimen due to unspecified reason: Secondary | ICD-10-CM

## 2023-07-29 NOTE — Progress Notes (Signed)
Patient was not seen for appt d/t no call, no show, or late arrival >10 mins past appt time.    Debera Lat PA [facility] [address] [place], [place] [address] [phone] (phone) [phone] (fax) [facility] show

## 2023-09-05 ENCOUNTER — Encounter (HOSPITAL_COMMUNITY): Payer: Self-pay

## 2023-09-05 ENCOUNTER — Emergency Department (HOSPITAL_COMMUNITY)
Admission: EM | Admit: 2023-09-05 | Discharge: 2023-09-05 | Discharge status: Against Medical Advice | Payer: No Typology Code available for payment source | Attending: Emergency Medicine | Admitting: Emergency Medicine

## 2023-09-05 DIAGNOSIS — S301XXA Contusion of abdominal wall, initial encounter: Secondary | ICD-10-CM | POA: Insufficient documentation

## 2023-09-05 DIAGNOSIS — R1030 Lower abdominal pain, unspecified: Secondary | ICD-10-CM | POA: Diagnosis present

## 2023-09-05 DIAGNOSIS — Z5321 Procedure and treatment not carried out due to patient leaving prior to being seen by health care provider: Secondary | ICD-10-CM | POA: Diagnosis not present

## 2023-09-05 DIAGNOSIS — Y92410 Unspecified street and highway as the place of occurrence of the external cause: Secondary | ICD-10-CM | POA: Diagnosis not present

## 2023-09-05 LAB — CBC WITH DIFFERENTIAL/PLATELET
Abs Immature Granulocytes: 0.02 10*3/uL (ref 0.00–0.07)
Basophils Absolute: 0.1 10*3/uL (ref 0.0–0.1)
Basophils Relative: 1 %
Eosinophils Absolute: 0.1 10*3/uL (ref 0.0–0.5)
Eosinophils Relative: 1 %
HCT: 38.9 % (ref 36.0–46.0)
Hemoglobin: 12.5 g/dL (ref 12.0–15.0)
Immature Granulocytes: 0 %
Lymphocytes Relative: 26 %
Lymphs Abs: 2.2 10*3/uL (ref 0.7–4.0)
MCH: 26.5 pg (ref 26.0–34.0)
MCHC: 32.1 g/dL (ref 30.0–36.0)
MCV: 82.4 fL (ref 80.0–100.0)
Monocytes Absolute: 0.6 10*3/uL (ref 0.1–1.0)
Monocytes Relative: 7 %
Neutro Abs: 5.5 10*3/uL (ref 1.7–7.7)
Neutrophils Relative %: 65 %
Platelets: 258 10*3/uL (ref 150–400)
RBC: 4.72 MIL/uL (ref 3.87–5.11)
RDW: 15 % (ref 11.5–15.5)
WBC: 8.4 10*3/uL (ref 4.0–10.5)
nRBC: 0 % (ref 0.0–0.2)

## 2023-09-05 LAB — COMPREHENSIVE METABOLIC PANEL
ALT: 17 U/L (ref 0–44)
AST: 20 U/L (ref 15–41)
Albumin: 4.2 g/dL (ref 3.5–5.0)
Alkaline Phosphatase: 67 U/L (ref 38–126)
Anion gap: 9 (ref 5–15)
BUN: 13 mg/dL (ref 6–20)
CO2: 22 mmol/L (ref 22–32)
Calcium: 8.9 mg/dL (ref 8.9–10.3)
Chloride: 105 mmol/L (ref 98–111)
Creatinine, Ser: 0.77 mg/dL (ref 0.44–1.00)
GFR, Estimated: >60 mL/min (ref >60)
Glucose, Bld: 98 mg/dL (ref 70–99)
Potassium: 3.8 mmol/L (ref 3.5–5.1)
Sodium: 136 mmol/L (ref 135–145)
Total Bilirubin: 0.4 mg/dL (ref <1.2)
Total Protein: 7.5 g/dL (ref 6.5–8.1)
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
## 2023-09-05 NOTE — ED Notes (Signed)
Pt expressed she was leaving.  IV removed.  Pt instructed on what to watch for and to come back with any concerns.
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
## 2023-10-22 ENCOUNTER — Telehealth: Payer: Self-pay | Admitting: Adult Health

## 2023-10-22 NOTE — Telephone Encounter (Signed)
Lmom for pt to sch cpe or ov

## 2023-10-30 NOTE — Progress Notes (Deleted)
 GYNECOLOGY  VISIT   HPI: 23 y.o.   Single  Caucasian female   G0P0000 with No LMP recorded. (Menstrual status: IUD).   here for: IUD removal     GYNECOLOGIC HISTORY: No LMP recorded. (Menstrual status: IUD). 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/12/2021   IUD (intrauterine device) in place 12/12/2021   Encounter for IUD insertion 05/31/2021   Acute blood loss anemia 12/20/2019   Tonsillar bleed 12/19/2019   Irregular periods 10/09/2019   Acne 12/22/2014   Influenza A (H1N1) 10/14/2013   Cutaneous wart 10/14/2013   ADHD (attention deficit hyperactivity disorder) 02/11/2013   Behavioral problems 02/11/2013    Past Medical History:  Diagnosis Date   ADHD (attention deficit hyperactivity disorder)    ADHD (attention deficit hyperactivity disorder)    Anxiety    Blood transfusion without reported diagnosis    after tonsillectomy   Depression     Past Surgical History:  Procedure Laterality Date   TONSILLECTOMY     TONSILLECTOMY AND ADENOIDECTOMY N/A 12/19/2019   Procedure: CONTROL OF TONSIL BLEED;  Surgeon: Graylin Shiver, MD;  Location: [facility] OR;  Service: ENT;  Laterality: N/A;    Current Outpatient Medications  Medication Sig Dispense Refill   escitalopram (LEXAPRO) 5 MG tablet Take 5 mg by mouth daily.     medroxyPROGESTERone (PROVERA) 5 MG tablet Take 1 tablet (5 mg total) by mouth daily for 12 days. Cyclic Provera 5 mg daily x 12 days every 3 months if no spontaneous menstrual period. 12 tablet 4   No current facility-administered medications for this visit.     ALLERGIES:  Patient has no known allergies.  Family History  Problem Relation Age of Onset   Alcohol abuse Mother    Arthritis Mother    Depression Mother    Mental illness Mother    Miscarriages / [place] Mother    Alcohol abuse Father    Depression Father    Drug abuse Father    Mental illness Father    Heart murmur Brother    Alcohol abuse Maternal Grandmother     Social History   Socioeconomic History   Marital status: Single    Spouse name: Not on file   Number of children: Not on file   Years of education: Not on file   Highest education level: Not on file  Occupational History   Not on file  Tobacco Use   Smoking status: Never    Passive exposure: Yes   Smokeless tobacco: Never  Vaping Use   Vaping status: Every Day  Substance and Sexual Activity   Alcohol use: No   Drug use: No   Sexual activity: Yes    Partners: Male    Birth control/protection: I.U.D., Condom    Comment: paragard inserted 04-11-22  Other Topics Concern  Not on file  Social History Narrative   Works at [employer] with boyfriend    No kids.   Social Drivers of Corporate investment banker Strain: Not on file  Food Insecurity: Not on file  Transportation Needs: Not on file  Physical Activity: Not on file  Stress: Not on file  Social Connections: Not on file  Intimate Partner Violence: Not on file    Review of Systems  PHYSICAL EXAMINATION:   There were no vitals taken for this visit.    General appearance: alert, cooperative and appears stated age Head: Normocephalic, without obvious abnormality, atraumatic Neck: no adenopathy, supple, symmetrical, trachea midline and thyroid normal to inspection and palpation Lungs: clear to auscultation bilaterally Breasts: normal appearance, no masses or tenderness, No nipple retraction or dimpling, No nipple discharge or bleeding, No axillary or supraclavicular adenopathy Heart: regular rate and rhythm Abdomen: soft, non-tender, no masses,   no organomegaly Extremities: extremities normal, atraumatic, no cyanosis or edema Skin: Skin color, texture, turgor normal. No rashes or lesions Lymph nodes: Cervical, supraclavicular, and axillary nodes normal. No abnormal inguinal nodes palpated Neurologic: Grossly normal  Pelvic: External genitalia:  no lesions              Urethra:  normal appearing urethra with no masses, tenderness or lesions              Bartholins and Skenes: normal                 Vagina: normal appearing vagina with normal color and discharge, no lesions              Cervix: no lesions                Bimanual Exam:  Uterus:  normal size, contour, position, consistency, mobility, non-tender              Adnexa: no mass, fullness, tenderness              Rectal exam: {yes no:314532}.  Confirms.              Anus:  normal sphincter tone, no lesions  Chaperone was present for exam:  {BSCHAPERONE:31226::"Marrie Chandra F, CMA"}  ASSESSMENT:    PLAN:    {LABS (Optional):23779}  ***  total time was spent for this patient encounter, including preparation, face-to-face counseling with the patient, coordination of care, and documentation of the encounter.

## 2023-11-13 ENCOUNTER — Ambulatory Visit: Payer: Medicaid Other | Admitting: Obstetrics and Gynecology

## 2024-02-07 ENCOUNTER — Encounter: Payer: Self-pay | Admitting: Radiology

## 2024-02-07 ENCOUNTER — Ambulatory Visit (INDEPENDENT_AMBULATORY_CARE_PROVIDER_SITE_OTHER): Payer: Self-pay | Admitting: Radiology

## 2024-02-07 VITALS — BP 102/74 | Wt 219.0 lb

## 2024-02-07 DIAGNOSIS — Z30432 Encounter for removal of intrauterine contraceptive device: Secondary | ICD-10-CM

## 2024-02-07 NOTE — Progress Notes (Signed)
   Kelly Bray 07/27/2001 [id number]   History:  23 y.o. G0 presents for removal of Paragard  IUD.  Reason for discontinuation: desires pregnancy, pelvic pain, partner can feel strings. Pt has been counseled about risks and benefits as well as complications.   Time out performed and Informed consent is obtained today.  Patient's last menstrual period was 02/03/2024.  Past medical history, past surgical history, family history and social history were all reviewed and documented in the EPIC chart.  ROS:  A ROS was performed and pertinent positives and negatives are included.  Exam: Vitals:   02/07/24 1506  BP: 102/74  Weight: 219 lb (99.3 kg)   Body mass index is 34.82 kg/m.  Pelvic exam: Vulva:  normal female genitalia Vagina:  normal vagina, no discharge, exudate, lesion, or erythema Cervix:  Non-tender, Negative CMT, no lesions or redness. IUD strings visualized. Uterus:  normal shape, position and consistency    Procedure:  Speculum inserted.   Cervix visualized, IUD grasps with forceps and removed without difficulty. Pt tolerated procedure well.   Chaperone, Kelly Bray, CMA present for exam   Assessment/Plan: 1. Encounter for IUD removal (Primary) Begin PNV daily Call with +UPT    Pt aware to call for any concerns, return to office for AEX with Dr Colvin Dec, patient is overdue.   Kelly Bray B WHNP-BC, 3:14 PM 02/07/2024

## 2024-07-09 ENCOUNTER — Encounter (HOSPITAL_COMMUNITY): Payer: Self-pay | Admitting: Emergency Medicine

## 2024-07-09 ENCOUNTER — Emergency Department (HOSPITAL_COMMUNITY)
Admission: EM | Admit: 2024-07-09 | Discharge: 2024-07-09 | Disposition: A | Discharge status: Home / Self Care | Payer: Self-pay | Attending: Emergency Medicine | Admitting: Emergency Medicine

## 2024-07-09 DIAGNOSIS — L03012 Cellulitis of left finger: Secondary | ICD-10-CM | POA: Insufficient documentation

## 2024-07-09 NOTE — ED Triage Notes (Signed)
 Pt arrives via POV with c/o abscess from ingrown nail to 4th digit on L hand. States went to [facility] yesterday and was prescribed abx.

## 2024-07-09 NOTE — Discharge Instructions (Signed)
 The same way that I showed you tonight you can soak the finger in hot soapy water for 20 minutes and then try to drain it again as I did here tonight.  Finish the antibiotics, see your doctor as needed, ER for severe or worsening symptoms

## 2024-07-09 NOTE — ED Provider Notes (Signed)
 [place] EMERGENCY DEPARTMENT AT [facility] Provider Note   CSN: [id number] Arrival date & time: 07/09/24  2219     Patient presents with: Skin Problem   Kelly Bray is a 23 y.o. female.   HPI   Well-appearing 23 year old female with recurrent paronychias on the fingers presents with several days of worsening swelling of the left fourth finger around the nailbed, denies any fevers or chills, was seen in urgent care yesterday and placed on an antibiotic but it has not gotten any better.  She states that typically when she gets that she pops them with a needle, she has not done that on this finger  Prior to Admission medications   Medication Sig Start Date End Date Taking? Authorizing Provider  escitalopram (LEXAPRO) 5 MG tablet Take 5 mg by mouth daily. 07/24/22   [provider]  medroxyPROGESTERone  (PROVERA ) 5 MG tablet Take 1 tablet (5 mg total) by mouth daily for 12 days. Cyclic Provera  5 mg daily x 12 days every 3 months if no spontaneous menstrual period. 08/03/22 08/15/22  Lavoie, Marie-Lyne, MD    Allergies: Patient has no known allergies.    Review of Systems  All other systems reviewed and are negative.   Updated Vital Signs BP 128/75 (BP Location: Right Arm)   Pulse 63   Temp 99.1 F (37.3 C) (Oral)   Resp 18   Ht 1.702 m (5' 7)   Wt 99.8 kg   SpO2 97%   BMI 34.46 kg/m   Physical Exam Vitals and nursing note reviewed.  Constitutional:      Appearance: She is well-developed. She is not diaphoretic.  HENT:     Head: Normocephalic and atraumatic.  Eyes:     General:        Right eye: No discharge.        Left eye: No discharge.     Conjunctiva/sclera: Conjunctivae normal.  Pulmonary:     Effort: Pulmonary effort is normal. No respiratory distress.  Musculoskeletal:     Right lower leg: No edema.     Left lower leg: No edema.     Comments: Left fourth finger with paronychia present at the proximal and radial surface of the  nail, there is no felon  Skin:    General: Skin is warm and dry.     Findings: No erythema or rash.  Neurological:     Mental Status: She is alert.     Coordination: Coordination normal.     (all labs ordered are listed, but only abnormal results are displayed) Labs Reviewed - No data to display  EKG: None  Radiology: No results found.   Procedures   Medications Ordered in the ED - No data to display                                  Medical Decision Making  Focal paronychia, I was able to show the patient and drained this at the bedside, no anesthesia needed, the scissors were opened and the small tine of the scissors was inserted just underneath the eponychial fold, the purulent material was extruded from this area and she felt immediately better  She is comfortable going home to do this at home as needed, she has antibiotics, she is safe and stable for discharge     Final diagnoses:  Paronychia of finger of left hand    ED Discharge Orders  None          Cleotilde Rogue, MD 07/09/24 2248

## 2024-09-09 ENCOUNTER — Encounter: Payer: Self-pay | Admitting: Obstetrics and Gynecology

## 2024-09-09 ENCOUNTER — Ambulatory Visit: Admitting: Obstetrics and Gynecology

## 2024-09-09 NOTE — Progress Notes (Deleted)
 23 y.o. G0P0000 Single Caucasian female here for annual exam. Discuss birth control options.   PCP: Merna Huxley, NP   No LMP recorded. (Menstrual status: IUD).           Sexually active: Yes.    The current method of family planning is condoms.    Menopausal hormone therapy:  n/a Exercising: {yes no:314532}  {types:19826} Smoker:  no  OB History  Gravida Para Term Preterm AB Living  0 0 0 0 0 0  SAB IAB Ectopic Multiple Live Births  0 0 0 0 0     HEALTH MAINTENANCE: Last 2 paps:  n/a History of abnormal Pap or positive HPV:  no Mammogram:   n/a Colonoscopy:  n/a Bone Density:  n/a  Result  n/a   Immunization History  Administered Date(s) Administered   DTaP 03/22/2003, 11/26/2006   HIB (PRP-OMP) 03/22/2003   HPV 9-valent 09/08/2019   Hepatitis A, Ped/Adol-2 Dose 09/08/2019   Hepatitis B, PED/ADOLESCENT 06/24/2013, 09/08/2019   IPV 11/26/2006   MMR 11/26/2006, 06/24/2013   Meningococcal B, OMV 09/08/2019   Meningococcal Conjugate 06/24/2013   Tdap 02/03/2013   Varicella 06/24/2013, 09/08/2019      reports that she has never smoked. She has been exposed to tobacco smoke. She has never used smokeless tobacco. She reports that she does not drink alcohol and does not use drugs.  Past Medical History:  Diagnosis Date   ADHD (attention deficit hyperactivity disorder)    ADHD (attention deficit hyperactivity disorder)    Anxiety    Blood transfusion without reported diagnosis    after tonsillectomy   Depression     Past Surgical History:  Procedure Laterality Date   TONSILLECTOMY     TONSILLECTOMY AND ADENOIDECTOMY N/A 12/19/2019   Procedure: CONTROL OF TONSIL BLEED;  Surgeon: Terri Alan PARAS, MD;  Location: [facility] OR;  Service: ENT;  Laterality: N/A;    Current Outpatient Medications  Medication Sig Dispense Refill   escitalopram (LEXAPRO) 5 MG tablet Take 5 mg by mouth daily.     medroxyPROGESTERone  (PROVERA ) 5 MG tablet Take 1 tablet (5 mg total) by  mouth daily for 12 days. Cyclic Provera  5 mg daily x 12 days every 3 months if no spontaneous menstrual period. 12 tablet 4   No current facility-administered medications for this visit.    ALLERGIES: Patient has no known allergies.  Family History  Problem Relation Age of Onset   Alcohol abuse Mother    Arthritis Mother    Depression Mother    Mental illness Mother    Miscarriages / Stillbirths Mother    Alcohol abuse Father    Depression Father    Drug abuse Father    Mental illness Father    Heart murmur Brother    Alcohol abuse Maternal Grandmother     Review of Systems  PHYSICAL EXAM:  There were no vitals taken for this visit.    General appearance: alert, cooperative and appears stated age Head: normocephalic, without obvious abnormality, atraumatic Neck: no adenopathy, supple, symmetrical, trachea midline and thyroid  normal to inspection and palpation Lungs: clear to auscultation bilaterally Breasts: normal appearance, no masses or tenderness, No nipple retraction or dimpling, No nipple discharge or bleeding, No axillary adenopathy Heart: regular rate and rhythm Abdomen: soft, non-tender; no masses, no organomegaly Extremities: extremities normal, atraumatic, no cyanosis or edema Skin: skin color, texture, turgor normal. No rashes or lesions Lymph nodes: cervical, supraclavicular, and axillary nodes normal. Neurologic: grossly normal  Pelvic: External  genitalia:  no lesions              No abnormal inguinal nodes palpated.              Urethra:  normal appearing urethra with no masses, tenderness or lesions              Bartholins and Skenes: normal                 Vagina: normal appearing vagina with normal color and discharge, no lesions              Cervix: no lesions              Pap taken: {yes no:314532} Bimanual Exam:  Uterus:  normal size, contour, position, consistency, mobility, non-tender              Adnexa: no mass, fullness, tenderness               Rectal exam: {yes no:314532}.  Confirms.              Anus:  normal sphincter tone, no lesions  Chaperone was present for exam:  {BSCHAPERONE:31226::Emily F, CMA}  ASSESSMENT: Well woman visit with gynecologic exam.  PHQ-2-9: ***  ***  PLAN: Mammogram screening discussed. Self breast awareness reviewed. Pap and HRV collected:  {yes no:314532} Guidelines for Calcium, Vitamin D, regular exercise program including cardiovascular and weight bearing exercise. Medication refills:  *** {LABS (Optional):23779} Follow up:  ***    Additional counseling given.  {yes [id number]. ***  total time was spent for this patient encounter, including preparation, face-to-face counseling with the patient, coordination of care, and documentation of the encounter in addition to doing the well woman visit with gynecologic exam.
